# Patient Record
Sex: Male | Born: 1935 | Race: White | Hispanic: No | Marital: Single | Smoking: Never smoker
Health system: Southern US, Community
[De-identification: ages and names within clinical notes are randomized; demographics above are authoritative.]

## PROBLEM LIST (undated history)

## (undated) DIAGNOSIS — E119 Type 2 diabetes mellitus without complications: Secondary | ICD-10-CM

## (undated) DIAGNOSIS — I10 Essential (primary) hypertension: Secondary | ICD-10-CM

## (undated) DIAGNOSIS — L570 Actinic keratosis: Secondary | ICD-10-CM

## (undated) HISTORY — DX: Actinic keratosis: L57.0

---

## 2004-01-07 ENCOUNTER — Other Ambulatory Visit: Payer: Self-pay

## 2004-12-10 ENCOUNTER — Other Ambulatory Visit: Payer: Self-pay

## 2004-12-29 ENCOUNTER — Inpatient Hospital Stay: Payer: Self-pay | Admitting: General Practice

## 2006-08-10 ENCOUNTER — Ambulatory Visit: Payer: Self-pay | Admitting: Internal Medicine

## 2007-08-11 ENCOUNTER — Ambulatory Visit: Payer: Self-pay | Admitting: Internal Medicine

## 2012-05-13 ENCOUNTER — Ambulatory Visit: Payer: Self-pay | Admitting: Internal Medicine

## 2014-04-09 ENCOUNTER — Emergency Department: Payer: Self-pay | Admitting: Emergency Medicine

## 2014-04-09 LAB — BASIC METABOLIC PANEL
ANION GAP: 4 — AB (ref 7–16)
BUN: 27 mg/dL — ABNORMAL HIGH (ref 7–18)
CO2: 30 mmol/L (ref 21–32)
CREATININE: 1.53 mg/dL — AB (ref 0.60–1.30)
Calcium, Total: 9.1 mg/dL (ref 8.5–10.1)
Chloride: 103 mmol/L (ref 98–107)
EGFR (African American): 47 — ABNORMAL LOW
EGFR (Non-African Amer.): 41 — ABNORMAL LOW
GLUCOSE: 218 mg/dL — AB (ref 65–99)
Osmolality: 286 (ref 275–301)
POTASSIUM: 4 mmol/L (ref 3.5–5.1)
Sodium: 137 mmol/L (ref 136–145)

## 2014-04-09 LAB — CBC WITH DIFFERENTIAL/PLATELET
BASOS PCT: 0.3 %
Basophil #: 0 10*3/uL (ref 0.0–0.1)
EOS ABS: 0.1 10*3/uL (ref 0.0–0.7)
EOS PCT: 0.9 %
HCT: 40.9 % (ref 40.0–52.0)
HGB: 13.5 g/dL (ref 13.0–18.0)
LYMPHS ABS: 1.4 10*3/uL (ref 1.0–3.6)
Lymphocyte %: 11.5 %
MCH: 31.2 pg (ref 26.0–34.0)
MCHC: 33.1 g/dL (ref 32.0–36.0)
MCV: 94 fL (ref 80–100)
Monocyte #: 1.4 x10 3/mm — ABNORMAL HIGH (ref 0.2–1.0)
Monocyte %: 12 %
Neutrophil #: 8.9 10*3/uL — ABNORMAL HIGH (ref 1.4–6.5)
Neutrophil %: 75.3 %
PLATELETS: 161 10*3/uL (ref 150–440)
RBC: 4.34 10*6/uL — ABNORMAL LOW (ref 4.40–5.90)
RDW: 13 % (ref 11.5–14.5)
WBC: 11.8 10*3/uL — AB (ref 3.8–10.6)

## 2014-04-09 LAB — URIC ACID: Uric Acid: 7.8 mg/dL — ABNORMAL HIGH (ref 3.5–7.2)

## 2014-05-24 ENCOUNTER — Emergency Department: Payer: Self-pay | Admitting: Emergency Medicine

## 2016-09-21 DIAGNOSIS — C4491 Basal cell carcinoma of skin, unspecified: Secondary | ICD-10-CM

## 2016-09-21 HISTORY — DX: Basal cell carcinoma of skin, unspecified: C44.91

## 2017-09-02 ENCOUNTER — Other Ambulatory Visit: Payer: Self-pay | Admitting: Internal Medicine

## 2017-09-02 DIAGNOSIS — R634 Abnormal weight loss: Secondary | ICD-10-CM

## 2017-09-09 ENCOUNTER — Ambulatory Visit
Admission: RE | Admit: 2017-09-09 | Discharge: 2017-09-09 | Disposition: A | Discharge status: Home / Self Care | Payer: Medicare Other | Source: Ambulatory Visit | Attending: Internal Medicine | Admitting: Internal Medicine

## 2017-09-09 DIAGNOSIS — R9341 Abnormal radiologic findings on diagnostic imaging of renal pelvis, ureter, or bladder: Secondary | ICD-10-CM | POA: Diagnosis not present

## 2017-09-09 DIAGNOSIS — I2510 Atherosclerotic heart disease of native coronary artery without angina pectoris: Secondary | ICD-10-CM | POA: Insufficient documentation

## 2017-09-09 DIAGNOSIS — R634 Abnormal weight loss: Secondary | ICD-10-CM | POA: Insufficient documentation

## 2017-09-09 DIAGNOSIS — I77810 Thoracic aortic ectasia: Secondary | ICD-10-CM | POA: Diagnosis not present

## 2017-09-09 HISTORY — DX: Type 2 diabetes mellitus without complications: E11.9

## 2017-09-09 HISTORY — DX: Essential (primary) hypertension: I10

## 2017-09-09 MED ORDER — IOPAMIDOL (ISOVUE-300) INJECTION 61%
100.0000 mL | Freq: Once | INTRAVENOUS | Status: AC | PRN
  Administered 2017-09-09: 100 mL via INTRAVENOUS

## 2017-12-10 ENCOUNTER — Emergency Department
Admission: EM | Admit: 2017-12-10 | Discharge: 2017-12-10 | Disposition: A | Discharge status: Home / Self Care | Payer: Medicare Other | Attending: Emergency Medicine | Admitting: Emergency Medicine

## 2017-12-10 ENCOUNTER — Encounter: Payer: Self-pay | Admitting: Emergency Medicine

## 2017-12-10 ENCOUNTER — Emergency Department: Payer: Medicare Other

## 2017-12-10 DIAGNOSIS — E119 Type 2 diabetes mellitus without complications: Secondary | ICD-10-CM | POA: Insufficient documentation

## 2017-12-10 DIAGNOSIS — M79622 Pain in left upper arm: Secondary | ICD-10-CM | POA: Diagnosis present

## 2017-12-10 DIAGNOSIS — Z7984 Long term (current) use of oral hypoglycemic drugs: Secondary | ICD-10-CM | POA: Diagnosis not present

## 2017-12-10 DIAGNOSIS — M7522 Bicipital tendinitis, left shoulder: Secondary | ICD-10-CM | POA: Diagnosis not present

## 2017-12-10 DIAGNOSIS — I10 Essential (primary) hypertension: Secondary | ICD-10-CM | POA: Insufficient documentation

## 2017-12-10 LAB — GLUCOSE, CAPILLARY: Glucose-Capillary: 94 mg/dL (ref 65–99)

## 2017-12-10 MED ORDER — TRAMADOL-ACETAMINOPHEN 37.5-325 MG PO TABS: 1.0000 | ORAL_TABLET | Freq: Two times a day (BID) | ORAL | 0 refills | Status: DC

## 2017-12-10 NOTE — ED Notes (Signed)
Returned from  U/S with family  Per U/S tech he became dizzy while having the test done

## 2017-12-10 NOTE — ED Provider Notes (Signed)
[facility] Emergency Department Provider Note   ____________________________________________   First MD Initiated Contact with Patient 12/10/17 [phone]     (approximate)  I have reviewed the triage vital signs and the nursing notes.   HISTORY  Chief Complaint Arm Pain    HPI [name] is a 83 y.o. male patient complaining of increasing left upper arm pain for 1 week. Patient now presents with limited range of motion with adduction and overhead reaching. A posterior long head biceps tendinitis is concerned. Patient denies any provocative incident for her complaint. No palliative measures for complaint.   Past Medical History:  Diagnosis Date  . Diabetes mellitus without complication ([place])   . Hypertension     There are no active problems to display for this patient.   History reviewed. No pertinent surgical history.  Prior to Admission medications   Medication Sig Start Date End Date Taking? Authorizing Provider  allopurinol (ZYLOPRIM) 100 MG tablet Take 100 mg by mouth daily.   Yes [provider]  colchicine 0.6 MG tablet Take 0.6 mg by mouth daily.   Yes [provider]  glimepiride (AMARYL) 4 MG tablet Take 4 mg by mouth daily with breakfast.   Yes [provider]  metoprolol tartrate (LOPRESSOR) 50 MG tablet Take 50 mg by mouth 2 (two) times daily.   Yes [provider]  simvastatin (ZOCOR) 40 MG tablet Take 40 mg by mouth daily.   Yes [provider]  valsartan-hydrochlorothiazide (DIOVAN-HCT) 160-12.5 MG tablet Take 1 tablet by mouth daily.   Yes [provider]  traMADol-acetaminophen (ULTRACET) 37.5-325 MG tablet Take 1 tablet by mouth 2 (two) times daily. 1 tablet every 12 hours when necessary pain. 12/10/17   Sable Feil, PA-C    Allergies Patient has no known allergies.  History reviewed. No pertinent family history.  Social History Social History   Tobacco Use  .  Smoking status: Not on file  Substance Use Topics  . Alcohol use: Not on file  . Drug use: Not on file    Review of Systems Constitutional: No fever/chills Eyes: No visual changes. ENT: No sore throat. Cardiovascular: Denies chest pain. Respiratory: Denies shortness of breath. Gastrointestinal: No abdominal pain.  No nausea, no vomiting.  No diarrhea.  No constipation. Genitourinary: Negative for dysuria. Musculoskeletal: Left upper arm pain Skin: Negative for rash. Neurological: Negative for headaches, focal weakness or numbness. Endocrine:Diabetes and hypertension ____________________________________________   PHYSICAL EXAM:  VITAL SIGNS: ED Triage Vitals  Enc Vitals Group     BP 12/10/17 0854 137/62     Pulse Rate 12/10/17 0854 (!) 57     Resp 12/10/17 0854 16     Temp 12/10/17 0854 98.1 F (36.7 C)     Temp Source 12/10/17 0854 Oral     SpO2 12/10/17 0854 99 %     Weight 12/10/17 0855 193 lb (87.5 kg)     Height 12/10/17 0855 5' 8.5" (1.74 m)     Head Circumference --      Peak Flow --      Pain Score 12/10/17 0851 6     Pain Loc --      Pain Edu? --      Excl. in [place]? --     Constitutional: Alert and oriented. Well appearing and in no acute distress. Cardiovascular: Normal rate, regular rhythm. Grossly normal heart sounds.  Good peripheral circulation. Respiratory: Normal respiratory effort.  No retractions. Lungs CTAB. Gastrointestinal: Soft  and nontender. No distention. No abdominal bruits. No CVA tenderness. Musculoskeletal: No obvious deformity edema or erythema to the left bicep area. Patient has moderate guarding palpation sensation part of that showed bicep head. Patient has decreased range of motion with extension and overhead reaching. Neurologic:  Normal speech and language. No gross focal neurologic deficits are appreciated. No gait instability. Skin:  Skin is warm, dry and intact. No rash noted. Psychiatric: Mood and affect are normal. Speech and  behavior are normal.  ____________________________________________   LABS (all labs ordered are listed, but only abnormal results are displayed)  Labs Reviewed  GLUCOSE, CAPILLARY  CBG MONITORING, ED   ____________________________________________  EKG   ____________________________________________  RADIOLOGY  [place] Extrem Up Left Ltd  Result Date: 12/10/2017 CLINICAL DATA:  Left biceps and forearm pain. EXAM: ULTRASOUND LEFT UPPER EXTREMITY LIMITED TECHNIQUE: Ultrasound examination of the upper extremity soft tissues was performed in the area of clinical concern. COMPARISON:  None FINDINGS: There is no visible muscle or tendon tear. No mass or abnormal fluid collection in the areas of concern. IMPRESSION: Negative ultrasound of the distal biceps and forearm. Electronically Signed   By: Lorriane Shire M.D.   On: 12/10/2017 10:31    ____________________________________________   PROCEDURES  Procedure(s) performed: None  Procedures  Critical Care performed: No  ____________________________________________   INITIAL IMPRESSION / ASSESSMENT AND PLAN / ED COURSE  As part of my medical decision making, I reviewed the following data within the electronic MEDICAL RECORD NUMBER    Left upper arm pain secondary to tendinitis. Discussed the ultrasound findings with patient. Patient given discharge care instruction prostate medication as needed for pain. Advised to follow up PCP if condition persists.      ____________________________________________   FINAL CLINICAL IMPRESSION(S) / ED DIAGNOSES  Final diagnoses:  Biceps tendinitis on left     ED Discharge Orders        Ordered    traMADol-acetaminophen (ULTRACET) 37.5-325 MG tablet  2 times daily     12/10/17 1038       Note:  This document was prepared using Dragon voice recognition software and may include unintentional dictation errors.    Sable Feil, PA-C 12/10/17 1042    Nena Polio, MD 12/10/17  [phone]

## 2017-12-10 NOTE — ED Triage Notes (Signed)
Patient arrives to [facility] ED via POV with complaint of left arm pain for 1 week. Patient states that pain has gotten progressively worse and now has limited ROM at the shoulder. Denies CP/SHOB

## 2017-12-10 NOTE — ED Notes (Signed)
See triage note.  Presents with left arm pain which started several days ago. Denies any injury  States pain is from mid upper arm and radiates to elbow  Increased pain with movement

## 2018-03-18 IMAGING — CT CT ABD-PELV W/ CM
1 of 3 series · 13 of 32 positions shown, 18 images · IV contrast (iopamidol)
Comparison: None.

CLINICAL DATA: Unexplained weight loss over the last 4 months,
fatigue

EXAM:
CT ABDOMEN AND PELVIS WITH CONTRAST
TECHNIQUE: Multidetector CT imaging of the abdomen and pelvis was performed
using the standard protocol following bolus administration of
intravenous contrast.
CONTRAST:  100mL 2GBUAR-K22 IOPAMIDOL (2GBUAR-K22) INJECTION 61%

[Series 2: axial st · axial · 0.84mm/px · z∈[-962,-522]mm · 13 of 100 slices shown, 18 images]
[im 6/100  soft-tissue]
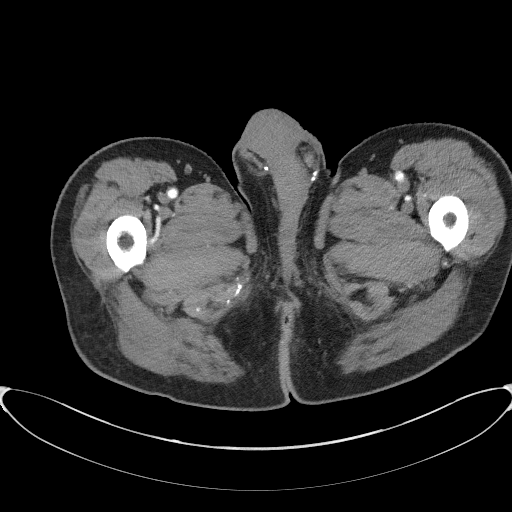
[im 6/100  bone]
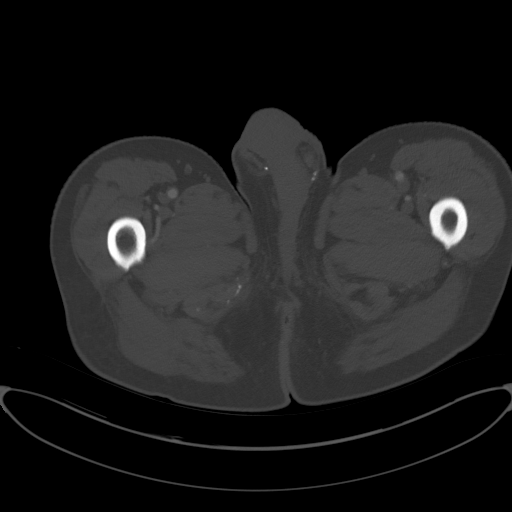
[im 17/100  soft-tissue]
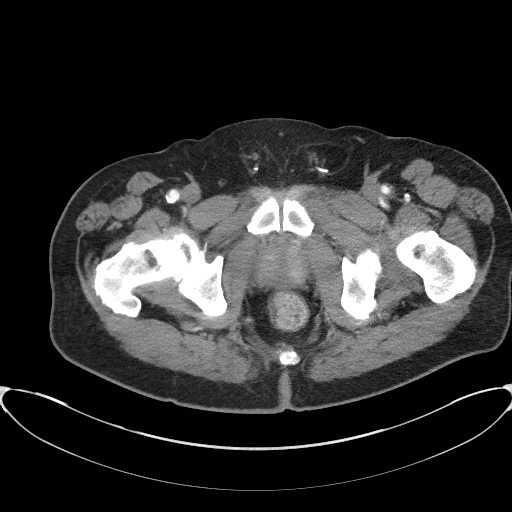
[im 23/100  soft-tissue]
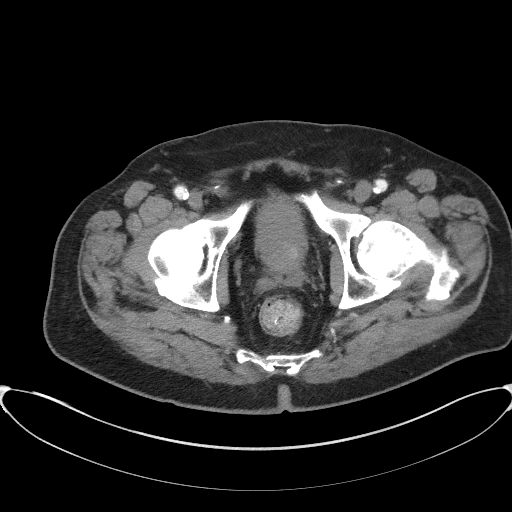
[im 28/100  soft-tissue]
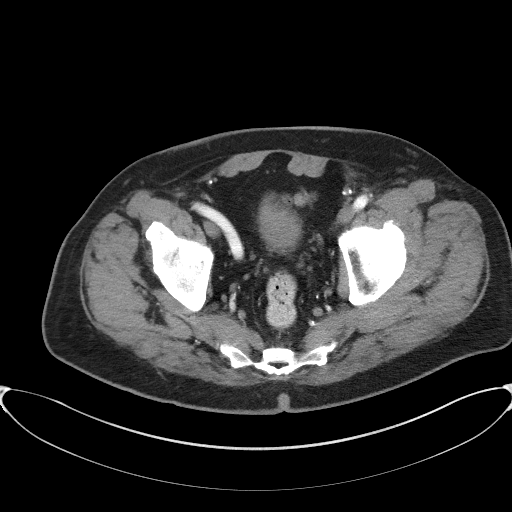
[im 39/100  soft-tissue]
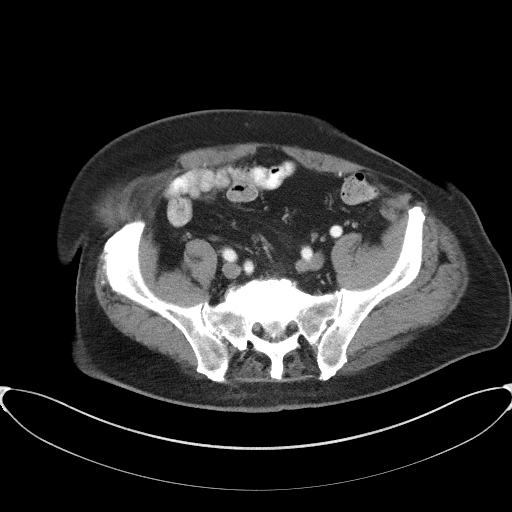
[im 45/100  soft-tissue]
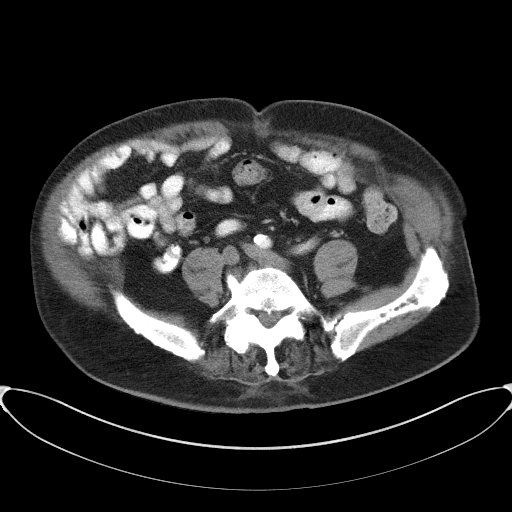
[im 56/100  soft-tissue]
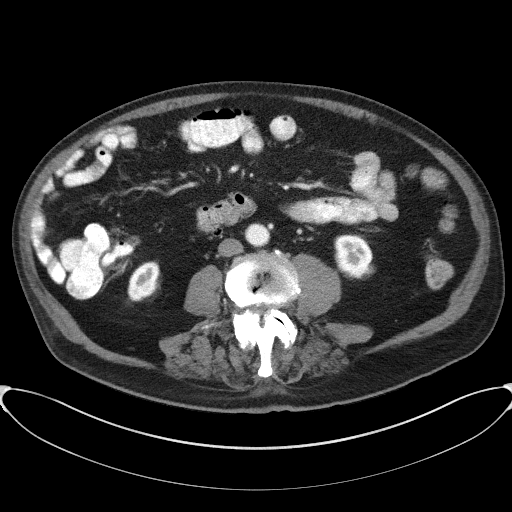
[im 61/100  soft-tissue]
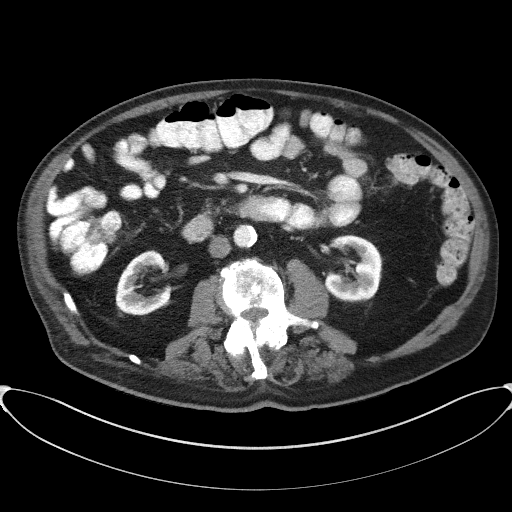
[im 72/100  soft-tissue]
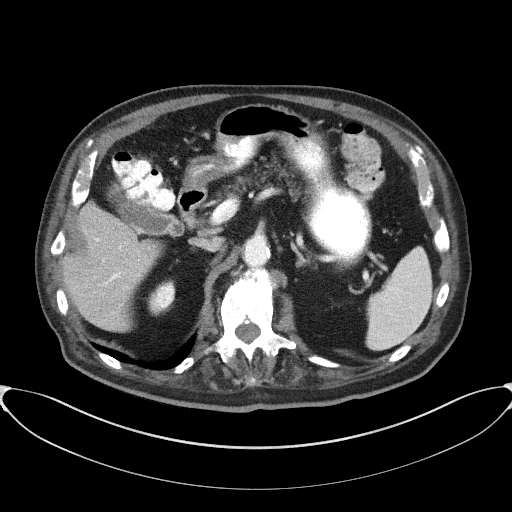
[im 72/100  bone]
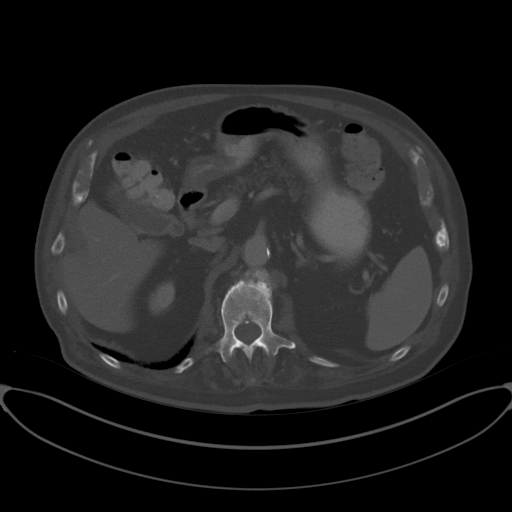
[im 78/100  soft-tissue]
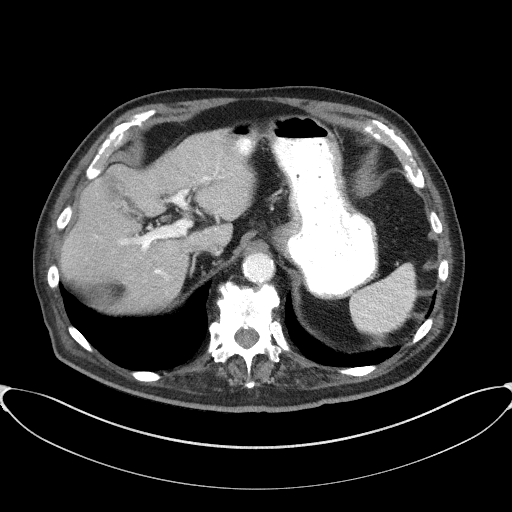
[im 78/100  lung]
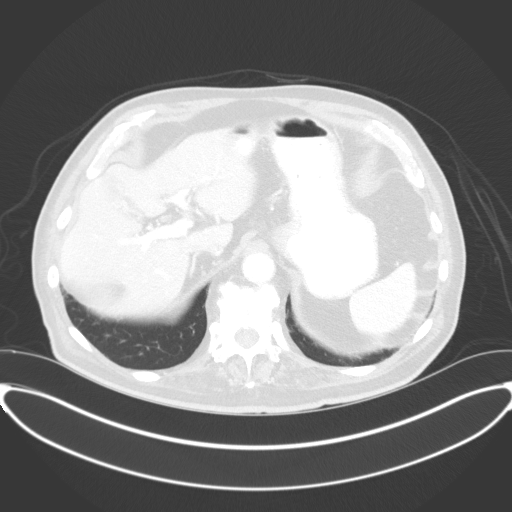
[im 83/100  soft-tissue]
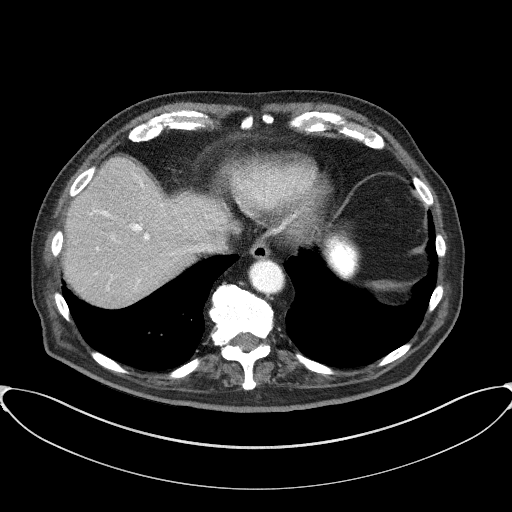
[im 83/100  lung]
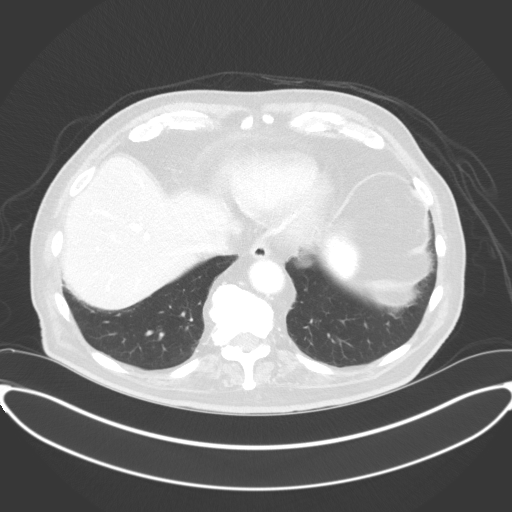
[im 89/100  lung]
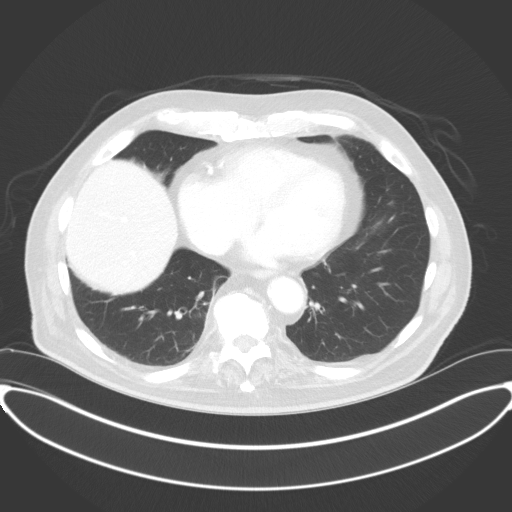
[im 94/100  soft-tissue]
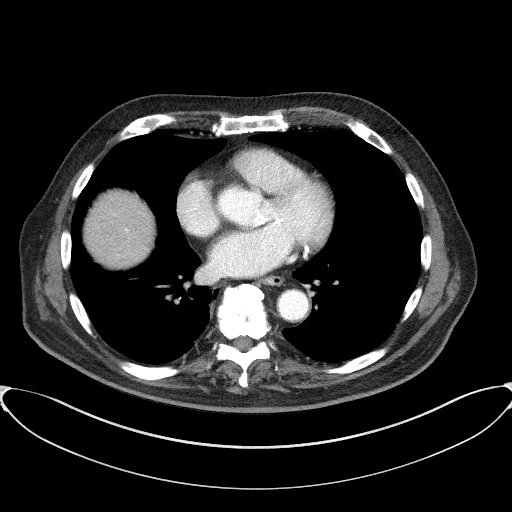
[im 94/100  lung]
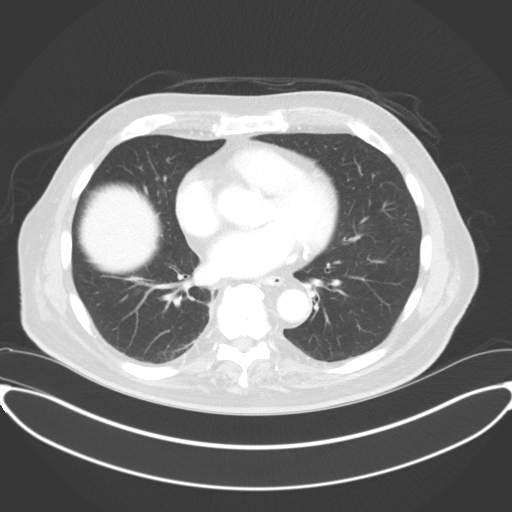

[13 of 32 positions shown; findings below may reference images not displayed]

FINDINGS: Lower chest: The lung bases are clear. The mid ascending thoracic
aorta measures 40 mm in diameter which may indicate developing
fusiform thoracic aortic aneurysm. Recommend annual imaging followup
by CTA or MRA. This recommendation follows 8535
ACCF/AHA/AATS/ACR/ASA/SCA/ASIL/SALAH EL/NASH/NADDEO Guidelines for the
Diagnosis and Management of Patients with Thoracic Aortic Disease.
Circulation. 8535; 121: [id number]-e369. Also, diffuse coronary artery
calcifications are noted. Moderate thoracic aortic atherosclerotic
change also is present.

Hepatobiliary: The liver enhances with no focal abnormality and no
ductal dilatation is seen. The gallbladder is visualized and no
calcified gallstones are seen the gallbladder wall is somewhat
irregular and thickened although no other evidence of acute
cholecystitis is seen by CT. Correlate clinically and consider
ultrasound of the gallbladder if warranted.

Pancreas: There is fatty infiltration of the pancreas diffusely. The
pancreatic duct is not dilated.

Spleen: The spleen is unremarkable.

Adrenals/Urinary Tract: The adrenal glands appear normal. The
kidneys enhance and on delayed images, the pelvocaliceal systems
appear normal. The ureters are normal in caliber to the urinary
bladder. The urinary bladder is very thick-walled and decompressed
making assessment difficult. In view of the diffuse wall thickening,
bladder outlet obstruction is a definite consideration.

Stomach/Bowel: The stomach is moderately distended with oral
contrast and food debris. No abnormality is seen. No small bowel
distention is noted. There are scattered rectosigmoid colon
diverticula present in the rectosigmoid colon is the elongated and
tortuous. The terminal ileum is unremarkable. The appendix is not
visualized, but no inflammatory process is seen.

Vascular/Lymphatic: The abdominal aorta is normal in caliber with
moderate abdominal aortic atherosclerotic change diffusely.

Reproductive: The prostate is normal in size for age.

Other: None.

Musculoskeletal: All
IMPRESSION: 1. No explanation for the patient's weight loss is seen.
2. Somewhat thickened gallbladder wall with irregularity of
uncertain significance. No other evidence of acute cholecystitis is
seen by CT. Consider ultrasound of the gallbladder to assess
further.
3. Fusiform dilatation of the ascending thoracic aorta worrisome for
developing neoplasm. Recommend annual imaging followup by CTA or
MRA. This recommendation follows 8535
ACCF/AHA/AATS/ACR/ASA/SCA/ASIL/SALAH EL/NASH/NADDEO Guidelines for the
Diagnosis and Management of Patients with Thoracic Aortic Disease.
Circulation. 8535; 121: [id number].
4. Diffuse coronary artery calcifications.
5. Very thick-walled urinary bladder which is decompressed. Consider
bladder outlet obstruction.

## 2019-10-09 IMAGING — US US EXTREM UP*L* LTD
1 series · 10 of 10 positions shown · non-contrast
Comparison: None

CLINICAL DATA: Left biceps and forearm pain.

EXAM:
ULTRASOUND LEFT UPPER EXTREMITY LIMITED
TECHNIQUE: Ultrasound examination of the upper extremity soft tissues was
performed in the area of clinical concern.

[Series 1: us extrem up*left* ltd · 0.07mm/px · 10 of 10 slices shown]
[im 1/10]
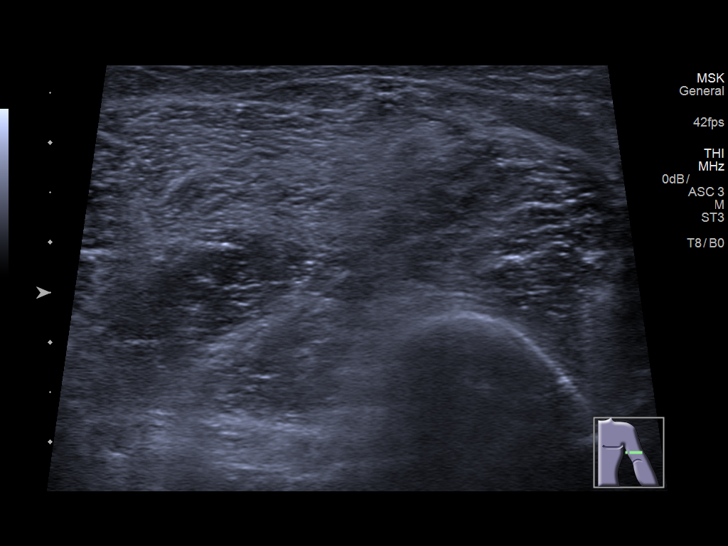
[im 2/10]
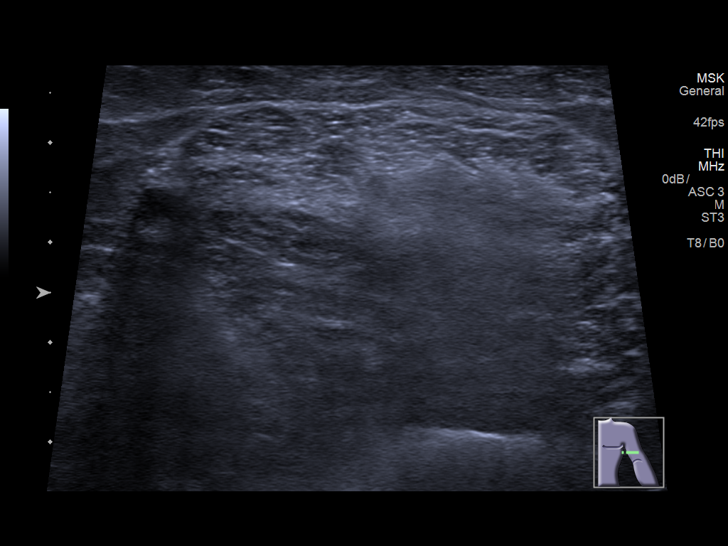
[im 3/10]
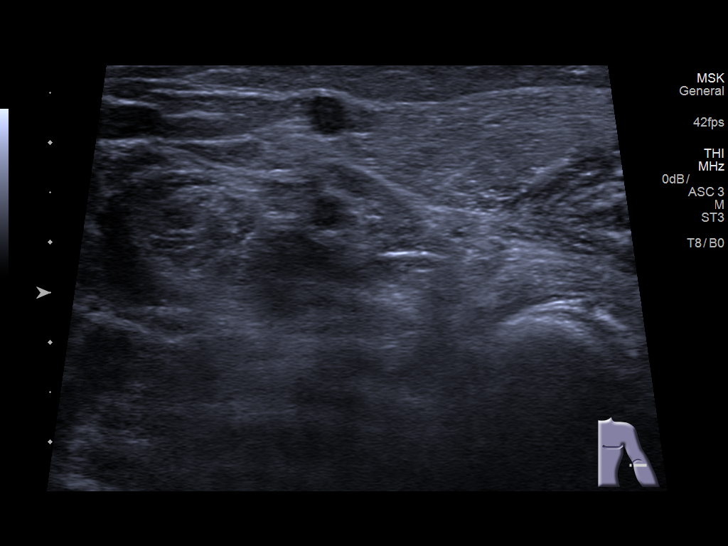
[im 4/10]
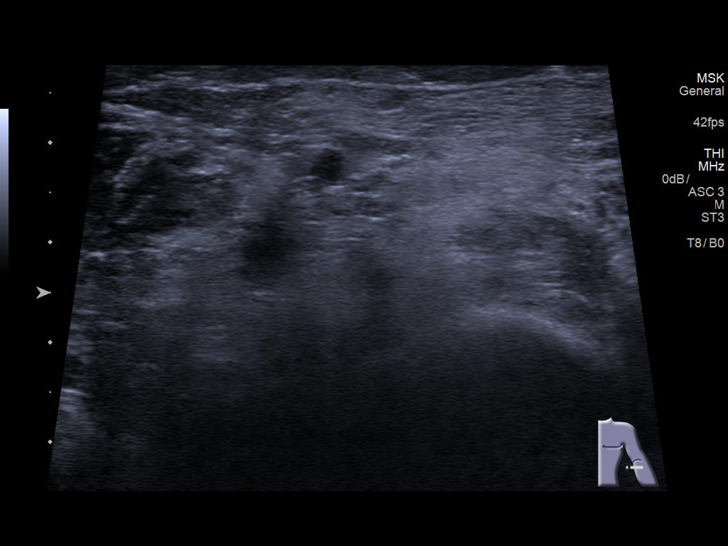
[im 5/10]
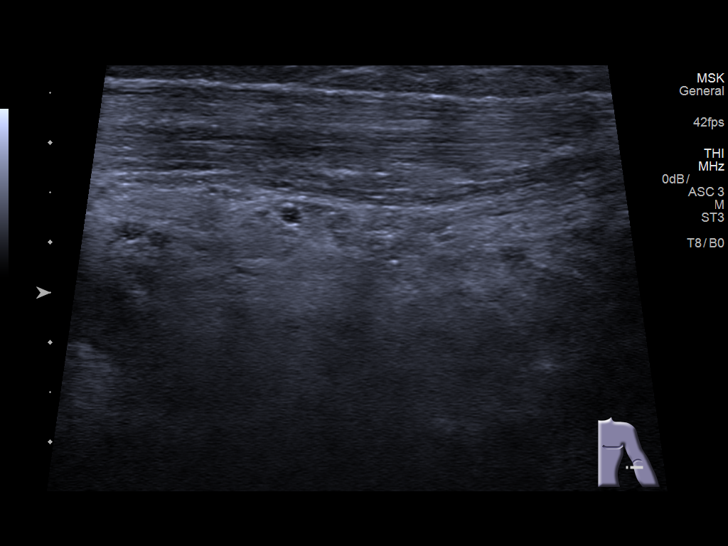
[im 6/10]
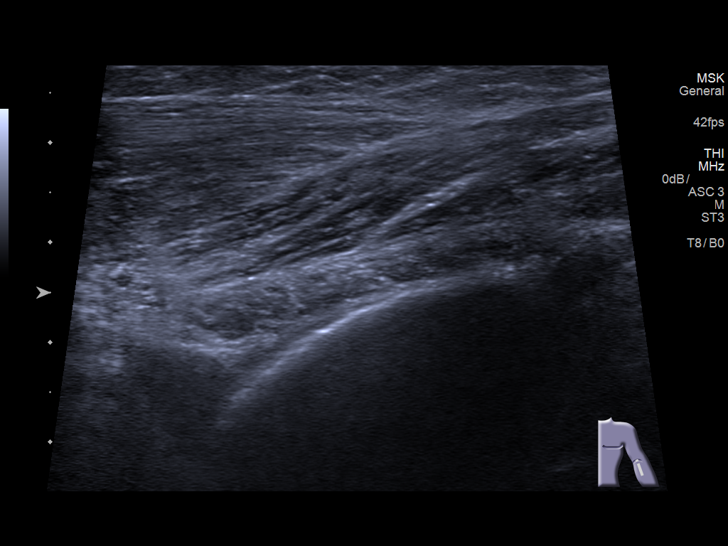
[im 7/10]
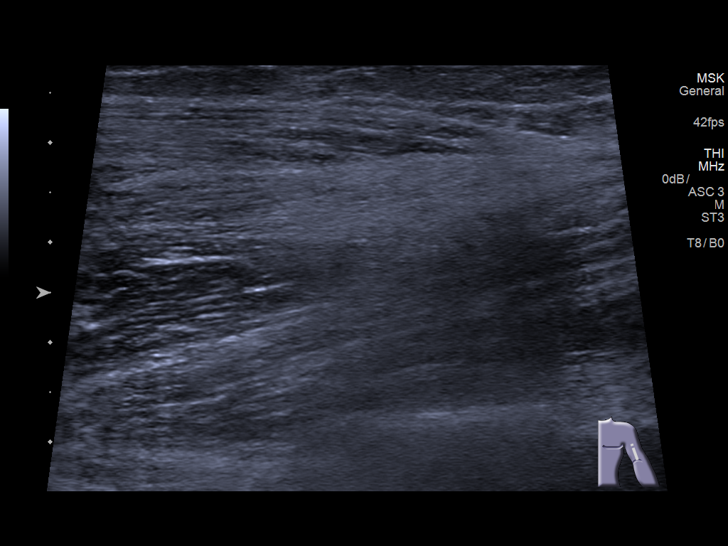
[im 8/10]
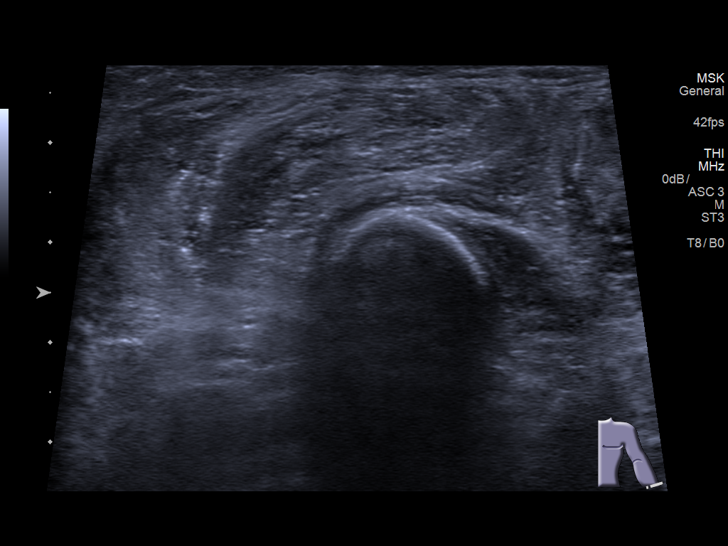
[im 9/10]
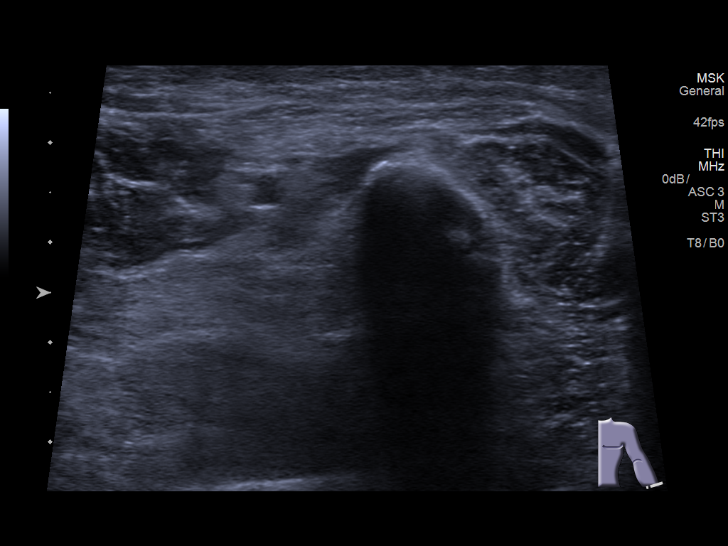
[im 10/10]
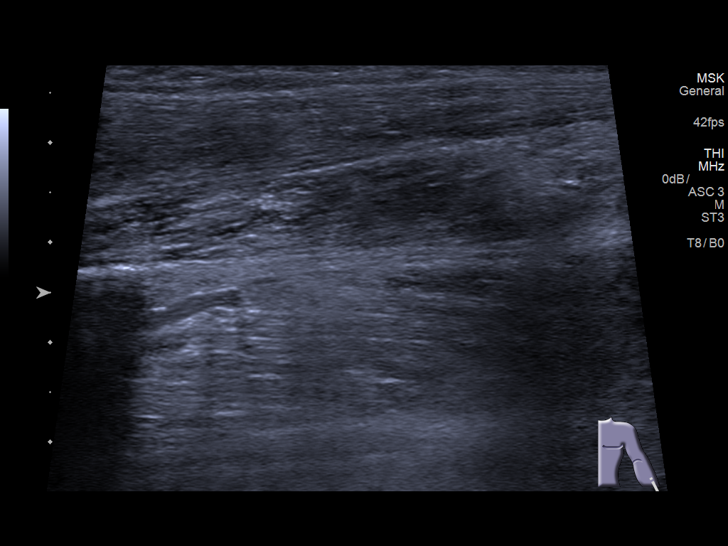

[10 of 10 positions shown; findings below may reference images not displayed]

FINDINGS: There is no visible muscle or tendon tear. No mass or abnormal fluid
collection in the areas of concern.
IMPRESSION: Negative ultrasound of the distal biceps and forearm.

## 2021-03-12 ENCOUNTER — Encounter: Payer: Self-pay | Admitting: Dermatology

## 2021-03-12 ENCOUNTER — Ambulatory Visit (INDEPENDENT_AMBULATORY_CARE_PROVIDER_SITE_OTHER): Payer: Medicare Other | Admitting: Dermatology

## 2021-03-12 ENCOUNTER — Other Ambulatory Visit: Payer: Self-pay

## 2021-03-12 DIAGNOSIS — L578 Other skin changes due to chronic exposure to nonionizing radiation: Secondary | ICD-10-CM | POA: Diagnosis not present

## 2021-03-12 DIAGNOSIS — D492 Neoplasm of unspecified behavior of bone, soft tissue, and skin: Secondary | ICD-10-CM

## 2021-03-12 DIAGNOSIS — C4492 Squamous cell carcinoma of skin, unspecified: Secondary | ICD-10-CM

## 2021-03-12 DIAGNOSIS — C4442 Squamous cell carcinoma of skin of scalp and neck: Secondary | ICD-10-CM | POA: Diagnosis not present

## 2021-03-12 DIAGNOSIS — L570 Actinic keratosis: Secondary | ICD-10-CM

## 2021-03-12 DIAGNOSIS — L821 Other seborrheic keratosis: Secondary | ICD-10-CM

## 2021-03-12 DIAGNOSIS — Z85828 Personal history of other malignant neoplasm of skin: Secondary | ICD-10-CM

## 2021-03-12 HISTORY — DX: Squamous cell carcinoma of skin, unspecified: C44.92

## 2021-03-12 NOTE — Progress Notes (Signed)
New Patient Visit  Subjective  [name] is a 87 y.o. male who presents for the following: check spot on scalp (Scalp, >5 yrs, pt thinks has partially been txted in past but no bx records in [place]).  Patient accompanied by Marvetta Gibbons, POA.  The following portions of the chart were reviewed this encounter and updated as appropriate:   Allergies  Meds  Problems  Med Hx  Surg Hx  Fam Hx      Review of Systems:  No other skin or systemic complaints except as noted in HPI or Assessment and Plan.  Objective  Well appearing patient in no apparent distress; mood and affect are within normal limits.  A focused examination was performed including scalp. Relevant physical exam findings are noted in the Assessment and Plan.  Objective  L of midline vertex scalp: Hyperkeratotic pap 1.2cm     Objective  scalp x 18 (18): Pink scaly macules    Assessment & Plan    Seborrheic Keratoses - Stuck-on, waxy, tan-brown papules and plaques  - Discussed benign etiology and prognosis. - Observe - Call for any changes  Actinic Damage - chronic, secondary to cumulative UV radiation exposure/sun exposure over time - diffuse scaly erythematous macules with underlying dyspigmentation - Recommend daily broad spectrum sunscreen SPF 30+ to sun-exposed areas, reapply every 2 hours as needed.  - Recommend staying in the shade or wearing long sleeves, sun glasses (UVA+UVB protection) and wide brim hats (4-inch brim around the entire circumference of the hat). - Call for new or changing lesions.  History of Basal Cell Carcinoma of the Skin - No evidence of recurrence today - Recommend regular full body skin exams - Recommend daily broad spectrum sunscreen SPF 30+ to sun-exposed areas, reapply every 2 hours as needed.  - Call if any new or changing lesions are noted between office visits - R shoulder  Neoplasm of skin L of midline vertex scalp  Epidermal / dermal  shaving  Lesion diameter (cm):  1.2 Informed consent: discussed and consent obtained   Timeout: patient name, date of birth, surgical site, and procedure verified   Procedure prep:  Patient was prepped and draped in usual sterile fashion Prep type:  Isopropyl alcohol Anesthesia: the lesion was anesthetized in a standard fashion   Anesthetic:  1% lidocaine w/ epinephrine 1-100,000 buffered w/ 8.4% NaHCO3 Instrument used: flexible razor blade   Hemostasis achieved with: pressure, aluminum chloride and electrodesiccation   Outcome: patient tolerated procedure well   Post-procedure details: sterile dressing applied and wound care instructions given   Dressing type: bandage and petrolatum    Destruction of lesion Complexity: extensive   Destruction method: electrodesiccation and curettage   Informed consent: discussed and consent obtained   Timeout:  patient name, date of birth, surgical site, and procedure verified Procedure prep:  Patient was prepped and draped in usual sterile fashion Prep type:  Isopropyl alcohol Anesthesia: the lesion was anesthetized in a standard fashion   Anesthetic:  1% lidocaine w/ epinephrine 1-100,000 buffered w/ 8.4% NaHCO3 Curettage performed in three different directions: Yes   Electrodesiccation performed over the curetted area: Yes   Lesion length (cm):  1.2 Lesion width (cm):  1.2 Margin per side (cm):  0.2 Final wound size (cm):  1.6 Hemostasis achieved with:  pressure, aluminum chloride and electrodesiccation Outcome: patient tolerated procedure well with no complications   Post-procedure details: sterile dressing applied and wound care instructions given   Dressing type: bandage and bacitracin  Specimen 1 - Surgical pathology Differential Diagnosis: D48.5 R/O SCC  Check Margins: No Hyperkeratotic pap 1.2cm EDC today  R/O SCC shave removal and EDC today.  Discussed risk of recurrence.  AK (actinic keratosis) (18) scalp x 18  Destruction  of lesion - scalp x 18 Complexity: simple   Destruction method: cryotherapy   Informed consent: discussed and consent obtained   Timeout:  patient name, date of birth, surgical site, and procedure verified Lesion destroyed using liquid nitrogen: Yes   Region frozen until ice ball extended beyond lesion: Yes   Outcome: patient tolerated procedure well with no complications   Post-procedure details: wound care instructions given    Return in about 4 months (around 07/12/2021) for [place] f/u.   I, Othelia Pulling, RMA, am acting as scribe for Sarina Ser, MD .  Documentation: I have reviewed the above documentation for accuracy and completeness, and I agree with the above.  Sarina Ser, MD

## 2021-03-12 NOTE — Patient Instructions (Addendum)
If you have any questions or concerns for your doctor, please call our main line at [phone] and press option 4 to reach your doctor's medical assistant. If no one answers, please leave a voicemail as directed and we will return your call as soon as possible. Messages left after 4 pm will be answered the following business day.   You may also send us a message via MyChart. We typically respond to MyChart messages within 1-2 business days.  For prescription refills, please ask your pharmacy to contact our office. Our fax number is [phone].  If you have an urgent issue when the clinic is closed that cannot wait until the next business day, you can page your doctor at the number below.    Please note that while we do our best to be available for urgent issues outside of office hours, we are not available 24/7.   If you have an urgent issue and are unable to reach us, you may choose to seek medical care at your doctor's office, retail clinic, urgent care center, or emergency room.  If you have a medical emergency, please immediately call 911 or go to the emergency department.  Pager Numbers  - Dr. Kowalski: [phone]  - Dr. Moye: [phone]  - Dr. Stewart: [phone]  In the event of inclement weather, please call our main line at [phone] for an update on the status of any delays or closures.  Dermatology Medication Tips: Please keep the boxes that topical medications come in in order to help keep track of the instructions about where and how to use these. Pharmacies typically print the medication instructions only on the boxes and not directly on the medication tubes.   If your medication is too expensive, please contact our office at [phone] option 4 or send us a message through MyChart.   We are unable to tell what your co-pay for medications will be in advance as this is different depending on your insurance coverage. However, we may be able to find a  substitute medication at lower cost or fill out paperwork to get insurance to cover a needed medication.   If a prior authorization is required to get your medication covered by your insurance company, please allow us 1-2 business days to complete this process.  Drug prices often vary depending on where the prescription is filled and some pharmacies may offer cheaper prices.  The website www.goodrx.com contains coupons for medications through different pharmacies. The prices here do not account for what the cost may be with help from insurance (it may be cheaper with your insurance), but the website can give you the price if you did not use any insurance.  - You can print the associated coupon and take it with your prescription to the pharmacy.  - You may also stop by our office during regular business hours and pick up a GoodRx coupon card.  - If you need your prescription sent electronically to a different pharmacy, notify our office through [facility] MyChart or by phone at [phone] option 4.     Wound Care Instructions  1. Cleanse wound gently with soap and water once a day then pat dry with clean gauze. Apply a thing coat of Petrolatum (petroleum jelly, "Vaseline") over the wound (unless you have an allergy to this). We recommend that you use a new, sterile tube of Vaseline. Do not pick or remove scabs. Do not remove the yellow or white "healing tissue" from the base of the wound.    2. Cover the wound with fresh, clean, nonstick gauze and secure with paper tape. You may use Band-Aids in place of gauze and tape if the would is small enough, but would recommend trimming much of the tape off as there is often too much. Sometimes Band-Aids can irritate the skin.  3. You should call the office for your biopsy report after 1 week if you have not already been contacted.  4. If you experience any problems, such as abnormal amounts of bleeding, swelling, significant bruising, significant pain,  or evidence of infection, please call the office immediately.  5. FOR ADULT SURGERY PATIENTS: If you need something for pain relief you may take 1 extra strength Tylenol (acetaminophen) AND 2 Ibuprofen (200mg each) together every 4 hours as needed for pain. (do not take these if you are allergic to them or if you have a reason you should not take them.) Typically, you may only need pain medication for 1 to 3 days.     

## 2021-03-17 ENCOUNTER — Encounter: Payer: Self-pay | Admitting: Dermatology

## 2021-03-17 ENCOUNTER — Telehealth: Payer: Self-pay

## 2021-03-17 NOTE — Telephone Encounter (Signed)
-----   Message from Ralene Bathe, MD sent at 03/17/2021 10:26 AM EDT ----- Diagnosis Skin , L of midline vertex scalp, skin WELL DIFFERENTIATED SQUAMOUS CELL CARCINOMA WITH SUPERFICIAL INFILTRATION, SEE DESCRIPTION  Cancer - SCC Already treated Recheck next visit Keep follow up appt

## 2021-03-17 NOTE — Telephone Encounter (Signed)
Called pt discussed biopsy results  

## 2021-06-17 ENCOUNTER — Inpatient Hospital Stay
Admission: EM | Admit: 2021-06-17 | Discharge: 2021-06-23 | DRG: 177 | Disposition: A | Discharge status: Hospice | Payer: Medicare Other | Attending: Internal Medicine | Admitting: Internal Medicine

## 2021-06-17 DIAGNOSIS — G9340 Encephalopathy, unspecified: Secondary | ICD-10-CM | POA: Diagnosis present

## 2021-06-17 DIAGNOSIS — Z79899 Other long term (current) drug therapy: Secondary | ICD-10-CM

## 2021-06-17 DIAGNOSIS — E785 Hyperlipidemia, unspecified: Secondary | ICD-10-CM | POA: Diagnosis present

## 2021-06-17 DIAGNOSIS — M109 Gout, unspecified: Secondary | ICD-10-CM | POA: Diagnosis present

## 2021-06-17 DIAGNOSIS — I10 Essential (primary) hypertension: Secondary | ICD-10-CM | POA: Diagnosis present

## 2021-06-17 DIAGNOSIS — G9341 Metabolic encephalopathy: Secondary | ICD-10-CM | POA: Diagnosis present

## 2021-06-17 DIAGNOSIS — D696 Thrombocytopenia, unspecified: Secondary | ICD-10-CM | POA: Diagnosis present

## 2021-06-17 DIAGNOSIS — R4182 Altered mental status, unspecified: Secondary | ICD-10-CM

## 2021-06-17 DIAGNOSIS — R531 Weakness: Secondary | ICD-10-CM

## 2021-06-17 DIAGNOSIS — R627 Adult failure to thrive: Secondary | ICD-10-CM | POA: Diagnosis present

## 2021-06-17 DIAGNOSIS — Z23 Encounter for immunization: Secondary | ICD-10-CM

## 2021-06-17 DIAGNOSIS — Z85828 Personal history of other malignant neoplasm of skin: Secondary | ICD-10-CM

## 2021-06-17 DIAGNOSIS — Z6831 Body mass index (BMI) 31.0-31.9, adult: Secondary | ICD-10-CM

## 2021-06-17 DIAGNOSIS — U071 COVID-19: Principal | ICD-10-CM

## 2021-06-17 DIAGNOSIS — Z66 Do not resuscitate: Secondary | ICD-10-CM | POA: Diagnosis present

## 2021-06-17 DIAGNOSIS — Z28310 Unvaccinated for covid-19: Secondary | ICD-10-CM

## 2021-06-17 DIAGNOSIS — Z8249 Family history of ischemic heart disease and other diseases of the circulatory system: Secondary | ICD-10-CM

## 2021-06-17 DIAGNOSIS — I2510 Atherosclerotic heart disease of native coronary artery without angina pectoris: Secondary | ICD-10-CM | POA: Diagnosis present

## 2021-06-17 DIAGNOSIS — E1165 Type 2 diabetes mellitus with hyperglycemia: Secondary | ICD-10-CM | POA: Diagnosis present

## 2021-06-17 DIAGNOSIS — R008 Other abnormalities of heart beat: Secondary | ICD-10-CM | POA: Diagnosis present

## 2021-06-17 MED ORDER — LACTATED RINGERS IV SOLN
INTRAVENOUS | Status: DC
  Administered 2021-06-18: 150 mL/h via INTRAVENOUS

## 2021-06-17 MED ORDER — SODIUM CHLORIDE 0.9 % IV SOLN
2.0000 g | Freq: Once | INTRAVENOUS | Status: AC
  Administered 2021-06-18: 2 g via INTRAVENOUS
  Filled 2021-06-17: qty 2

## 2021-06-17 MED ORDER — VANCOMYCIN HCL IN DEXTROSE 1-5 GM/200ML-% IV SOLN
1000.0000 mg | Freq: Once | INTRAVENOUS | Status: AC
  Administered 2021-06-18: 1000 mg via INTRAVENOUS
  Filled 2021-06-17: qty 200

## 2021-06-18 ENCOUNTER — Emergency Department: Payer: Medicare Other

## 2021-06-18 DIAGNOSIS — U071 COVID-19: Secondary | ICD-10-CM | POA: Diagnosis present

## 2021-06-18 DIAGNOSIS — E1165 Type 2 diabetes mellitus with hyperglycemia: Secondary | ICD-10-CM

## 2021-06-18 DIAGNOSIS — Z28310 Unvaccinated for covid-19: Secondary | ICD-10-CM | POA: Diagnosis not present

## 2021-06-18 DIAGNOSIS — G9340 Encephalopathy, unspecified: Secondary | ICD-10-CM

## 2021-06-18 DIAGNOSIS — G9341 Metabolic encephalopathy: Secondary | ICD-10-CM | POA: Diagnosis present

## 2021-06-18 DIAGNOSIS — M109 Gout, unspecified: Secondary | ICD-10-CM | POA: Diagnosis present

## 2021-06-18 DIAGNOSIS — Z6831 Body mass index (BMI) 31.0-31.9, adult: Secondary | ICD-10-CM | POA: Diagnosis not present

## 2021-06-18 DIAGNOSIS — I10 Essential (primary) hypertension: Secondary | ICD-10-CM | POA: Diagnosis present

## 2021-06-18 DIAGNOSIS — Z79899 Other long term (current) drug therapy: Secondary | ICD-10-CM | POA: Diagnosis not present

## 2021-06-18 DIAGNOSIS — D696 Thrombocytopenia, unspecified: Secondary | ICD-10-CM | POA: Diagnosis present

## 2021-06-18 DIAGNOSIS — Z66 Do not resuscitate: Secondary | ICD-10-CM | POA: Diagnosis present

## 2021-06-18 DIAGNOSIS — R531 Weakness: Secondary | ICD-10-CM

## 2021-06-18 DIAGNOSIS — R008 Other abnormalities of heart beat: Secondary | ICD-10-CM | POA: Diagnosis present

## 2021-06-18 DIAGNOSIS — Z8249 Family history of ischemic heart disease and other diseases of the circulatory system: Secondary | ICD-10-CM | POA: Diagnosis not present

## 2021-06-18 DIAGNOSIS — E785 Hyperlipidemia, unspecified: Secondary | ICD-10-CM | POA: Diagnosis present

## 2021-06-18 DIAGNOSIS — Z85828 Personal history of other malignant neoplasm of skin: Secondary | ICD-10-CM | POA: Diagnosis not present

## 2021-06-18 DIAGNOSIS — Z23 Encounter for immunization: Secondary | ICD-10-CM | POA: Diagnosis not present

## 2021-06-18 DIAGNOSIS — I2510 Atherosclerotic heart disease of native coronary artery without angina pectoris: Secondary | ICD-10-CM | POA: Diagnosis present

## 2021-06-18 DIAGNOSIS — R627 Adult failure to thrive: Secondary | ICD-10-CM | POA: Diagnosis present

## 2021-06-18 LAB — CBC WITH DIFFERENTIAL/PLATELET
Abs Immature Granulocytes: 0.01 10*3/uL (ref 0.00–0.07)
Basophils Absolute: 0 10*3/uL (ref 0.0–0.1)
Basophils Relative: 1 %
Eosinophils Absolute: 0 10*3/uL (ref 0.0–0.5)
Eosinophils Relative: 0 %
HCT: 47.4 % (ref 39.0–52.0)
Hemoglobin: 16 g/dL (ref 13.0–17.0)
Immature Granulocytes: 0 %
Lymphocytes Relative: 28 %
Lymphs Abs: 1.6 10*3/uL (ref 0.7–4.0)
MCH: 30.8 pg (ref 26.0–34.0)
MCHC: 33.8 g/dL (ref 30.0–36.0)
MCV: 91.2 fL (ref 80.0–100.0)
Monocytes Absolute: 1 10*3/uL (ref 0.1–1.0)
Monocytes Relative: 18 %
Neutro Abs: 3 10*3/uL (ref 1.7–7.7)
Neutrophils Relative %: 53 %
Platelets: 117 10*3/uL — ABNORMAL LOW (ref 150–400)
RBC: 5.2 MIL/uL (ref 4.22–5.81)
RDW: 13 % (ref 11.5–15.5)
WBC: 5.6 10*3/uL (ref 4.0–10.5)
nRBC: 0 % (ref 0.0–0.2)

## 2021-06-18 LAB — COMPREHENSIVE METABOLIC PANEL
ALT: 22 U/L (ref 0–44)
AST: 93 U/L — ABNORMAL HIGH (ref 15–41)
Albumin: 3.3 g/dL — ABNORMAL LOW (ref 3.5–5.0)
Alkaline Phosphatase: 42 U/L (ref 38–126)
Anion gap: 9 (ref 5–15)
BUN: 22 mg/dL (ref 8–23)
CO2: 25 mmol/L (ref 22–32)
Calcium: 8.8 mg/dL — ABNORMAL LOW (ref 8.9–10.3)
Chloride: 103 mmol/L (ref 98–111)
Creatinine, Ser: 1.34 mg/dL — ABNORMAL HIGH (ref 0.61–1.24)
GFR, Estimated: 50 mL/min — ABNORMAL LOW (ref >60)
Glucose, Bld: 238 mg/dL — ABNORMAL HIGH (ref 70–99)
Potassium: 3.9 mmol/L (ref 3.5–5.1)
Sodium: 137 mmol/L (ref 135–145)
Total Bilirubin: 0.7 mg/dL (ref 0.3–1.2)
Total Protein: 6.4 g/dL — ABNORMAL LOW (ref 6.5–8.1)

## 2021-06-18 LAB — PROCALCITONIN: Procalcitonin: <0.1 ng/mL

## 2021-06-18 LAB — PROTIME-INR
INR: 1 (ref 0.8–1.2)
Prothrombin Time: 13 seconds (ref 11.4–15.2)

## 2021-06-18 LAB — CBG MONITORING, ED
Glucose-Capillary: 190 mg/dL — ABNORMAL HIGH (ref 70–99)
Glucose-Capillary: 250 mg/dL — ABNORMAL HIGH (ref 70–99)
Glucose-Capillary: 233 mg/dL — ABNORMAL HIGH (ref 70–99)
Glucose-Capillary: 218 mg/dL — ABNORMAL HIGH (ref 70–99)
Glucose-Capillary: 157 mg/dL — ABNORMAL HIGH (ref 70–99)

## 2021-06-18 LAB — URINALYSIS, COMPLETE (UACMP) WITH MICROSCOPIC
Bilirubin Urine: NEGATIVE
Glucose, UA: 150 mg/dL — AB
Ketones, ur: 5 mg/dL — AB
Leukocytes,Ua: NEGATIVE
Nitrite: NEGATIVE
Protein, ur: 30 mg/dL — AB
Specific Gravity, Urine: 1.023 (ref 1.005–1.030)
pH: 5 (ref 5.0–8.0)

## 2021-06-18 LAB — LACTIC ACID, PLASMA: Lactic Acid, Venous: 1.7 mmol/L (ref 0.5–1.9)

## 2021-06-18 LAB — LACTATE DEHYDROGENASE: LDH: 168 U/L (ref 98–192)

## 2021-06-18 LAB — TRIGLYCERIDES: Triglycerides: 71 mg/dL (ref <150)

## 2021-06-18 LAB — C-REACTIVE PROTEIN: CRP: 3.6 mg/dL — ABNORMAL HIGH (ref <1.0)

## 2021-06-18 LAB — MAGNESIUM: Magnesium: 1.9 mg/dL (ref 1.7–2.4)

## 2021-06-18 LAB — RESP PANEL BY RT-PCR (FLU A&B, COVID) ARPGX2
Influenza A by PCR: NEGATIVE
Influenza B by PCR: NEGATIVE
SARS Coronavirus 2 by RT PCR: POSITIVE — AB

## 2021-06-18 LAB — APTT: aPTT: 31 seconds (ref 24–36)

## 2021-06-18 LAB — FIBRINOGEN: Fibrinogen: 363 mg/dL (ref 210–475)

## 2021-06-18 LAB — FERRITIN: Ferritin: 242 ng/mL (ref 24–336)

## 2021-06-18 MED ORDER — IRBESARTAN 150 MG PO TABS
150.0000 mg | ORAL_TABLET | Freq: Every day | ORAL | Status: DC
  Administered 2021-06-20 – 2021-06-23 (×4): 150 mg via ORAL
  Filled 2021-06-18 (×6): qty 1

## 2021-06-18 MED ORDER — ONDANSETRON HCL 4 MG PO TABS: 4.0000 mg | ORAL_TABLET | Freq: Four times a day (QID) | ORAL | Status: DC | PRN

## 2021-06-18 MED ORDER — HYDROCOD POLST-CPM POLST ER 10-8 MG/5ML PO SUER: 5.0000 mL | Freq: Two times a day (BID) | ORAL | Status: DC | PRN

## 2021-06-18 MED ORDER — SODIUM CHLORIDE 0.9 % IV SOLN: INTRAVENOUS | Status: DC

## 2021-06-18 MED ORDER — HYDROCHLOROTHIAZIDE 12.5 MG PO CAPS
12.5000 mg | ORAL_CAPSULE | Freq: Every day | ORAL | Status: DC
  Administered 2021-06-19 – 2021-06-23 (×5): 12.5 mg via ORAL
  Filled 2021-06-18 (×5): qty 1

## 2021-06-18 MED ORDER — ACETAMINOPHEN 500 MG PO TABS
1000.0000 mg | ORAL_TABLET | Freq: Once | ORAL | Status: AC
  Administered 2021-06-18: 1000 mg via ORAL
  Filled 2021-06-18: qty 2

## 2021-06-18 MED ORDER — ENOXAPARIN SODIUM 40 MG/0.4ML IJ SOSY
40.0000 mg | PREFILLED_SYRINGE | INTRAMUSCULAR | Status: DC
  Administered 2021-06-18 – 2021-06-22 (×5): 40 mg via SUBCUTANEOUS
  Filled 2021-06-18 (×5): qty 0.4

## 2021-06-18 MED ORDER — SODIUM CHLORIDE 0.9 % IV SOLN: 100.0000 mg | Freq: Once | INTRAVENOUS | Status: DC

## 2021-06-18 MED ORDER — FAMOTIDINE 20 MG PO TABS
20.0000 mg | ORAL_TABLET | Freq: Two times a day (BID) | ORAL | Status: DC
  Administered 2021-06-18 – 2021-06-23 (×9): 20 mg via ORAL
  Filled 2021-06-18 (×10): qty 1

## 2021-06-18 MED ORDER — SODIUM CHLORIDE 0.9 % IV SOLN: 200.0000 mg | Freq: Once | INTRAVENOUS | Status: DC

## 2021-06-18 MED ORDER — ASPIRIN EC 81 MG PO TBEC
81.0000 mg | DELAYED_RELEASE_TABLET | Freq: Every day | ORAL | Status: DC
  Administered 2021-06-18 – 2021-06-23 (×6): 81 mg via ORAL
  Filled 2021-06-18 (×6): qty 1

## 2021-06-18 MED ORDER — GUAIFENESIN-DM 100-10 MG/5ML PO SYRP: 10.0000 mL | ORAL_SOLUTION | ORAL | Status: DC | PRN

## 2021-06-18 MED ORDER — ENOXAPARIN SODIUM 40 MG/0.4ML IJ SOSY: 40.0000 mg | PREFILLED_SYRINGE | INTRAMUSCULAR | Status: DC

## 2021-06-18 MED ORDER — ALLOPURINOL 100 MG PO TABS
100.0000 mg | ORAL_TABLET | Freq: Every day | ORAL | Status: DC
  Administered 2021-06-20 – 2021-06-23 (×4): 100 mg via ORAL
  Filled 2021-06-18 (×6): qty 1

## 2021-06-18 MED ORDER — VALSARTAN-HYDROCHLOROTHIAZIDE 160-12.5 MG PO TABS: 1.0000 | ORAL_TABLET | Freq: Every day | ORAL | Status: DC

## 2021-06-18 MED ORDER — INSULIN ASPART 100 UNIT/ML IJ SOLN
0.0000 [IU] | Freq: Three times a day (TID) | INTRAMUSCULAR | Status: DC
  Administered 2021-06-18: 2 [IU] via SUBCUTANEOUS
  Filled 2021-06-18: qty 1

## 2021-06-18 MED ORDER — MAGNESIUM HYDROXIDE 400 MG/5ML PO SUSP
30.0000 mL | Freq: Every day | ORAL | Status: DC | PRN
  Filled 2021-06-18: qty 30

## 2021-06-18 MED ORDER — ASCORBIC ACID 500 MG PO TABS
500.0000 mg | ORAL_TABLET | Freq: Every day | ORAL | Status: DC
  Administered 2021-06-18 – 2021-06-23 (×6): 500 mg via ORAL
  Filled 2021-06-18 (×6): qty 1

## 2021-06-18 MED ORDER — ACETAMINOPHEN 325 MG PO TABS: 650.0000 mg | ORAL_TABLET | Freq: Four times a day (QID) | ORAL | Status: DC | PRN

## 2021-06-18 MED ORDER — SIMVASTATIN 10 MG PO TABS: 40.0000 mg | ORAL_TABLET | Freq: Every day | ORAL | Status: DC

## 2021-06-18 MED ORDER — GLIMEPIRIDE 4 MG PO TABS: 4.0000 mg | ORAL_TABLET | Freq: Every day | ORAL | Status: DC

## 2021-06-18 MED ORDER — INSULIN ASPART 100 UNIT/ML IJ SOLN
0.0000 [IU] | Freq: Every day | INTRAMUSCULAR | Status: DC
  Administered 2021-06-19 – 2021-06-22 (×3): 2 [IU] via SUBCUTANEOUS
  Filled 2021-06-18 (×3): qty 1

## 2021-06-18 MED ORDER — SODIUM CHLORIDE 0.9 % IV SOLN
200.0000 mg | Freq: Once | INTRAVENOUS | Status: AC
  Administered 2021-06-18: 200 mg via INTRAVENOUS
  Filled 2021-06-18: qty 40

## 2021-06-18 MED ORDER — SODIUM CHLORIDE 0.9 % IV SOLN
100.0000 mg | Freq: Every day | INTRAVENOUS | Status: AC
  Administered 2021-06-19 – 2021-06-21 (×3): 100 mg via INTRAVENOUS
  Filled 2021-06-18: qty 20
  Filled 2021-06-18: qty 100
  Filled 2021-06-18: qty 20
  Filled 2021-06-18: qty 100

## 2021-06-18 MED ORDER — ONDANSETRON HCL 4 MG/2ML IJ SOLN: 4.0000 mg | Freq: Four times a day (QID) | INTRAMUSCULAR | Status: DC | PRN

## 2021-06-18 MED ORDER — ZINC SULFATE 220 (50 ZN) MG PO CAPS
220.0000 mg | ORAL_CAPSULE | Freq: Every day | ORAL | Status: DC
  Administered 2021-06-18 – 2021-06-23 (×6): 220 mg via ORAL
  Filled 2021-06-18 (×6): qty 1

## 2021-06-18 MED ORDER — COLCHICINE 0.6 MG PO TABS
0.6000 mg | ORAL_TABLET | Freq: Every day | ORAL | Status: DC
  Administered 2021-06-19 – 2021-06-23 (×5): 0.6 mg via ORAL
  Filled 2021-06-18 (×6): qty 1

## 2021-06-18 MED ORDER — LINAGLIPTIN 5 MG PO TABS
5.0000 mg | ORAL_TABLET | Freq: Every day | ORAL | Status: DC
  Administered 2021-06-18 – 2021-06-23 (×5): 5 mg via ORAL
  Filled 2021-06-18 (×7): qty 1

## 2021-06-18 MED ORDER — HALOPERIDOL LACTATE 5 MG/ML IJ SOLN: 1.0000 mg | Freq: Four times a day (QID) | INTRAMUSCULAR | Status: DC | PRN

## 2021-06-18 MED ORDER — INSULIN ASPART 100 UNIT/ML IJ SOLN
0.0000 [IU] | Freq: Three times a day (TID) | INTRAMUSCULAR | Status: DC
  Administered 2021-06-18 (×2): 3 [IU] via SUBCUTANEOUS
  Administered 2021-06-19 (×2): 2 [IU] via SUBCUTANEOUS
  Administered 2021-06-20: 08:00:00 1 [IU] via SUBCUTANEOUS
  Administered 2021-06-20 – 2021-06-22 (×5): 2 [IU] via SUBCUTANEOUS
  Administered 2021-06-22: 1 [IU] via SUBCUTANEOUS
  Administered 2021-06-23: 5 [IU] via SUBCUTANEOUS
  Filled 2021-06-18 (×12): qty 1

## 2021-06-18 MED ORDER — SIMVASTATIN 20 MG PO TABS
40.0000 mg | ORAL_TABLET | Freq: Every day | ORAL | Status: DC
  Administered 2021-06-18 – 2021-06-22 (×4): 40 mg via ORAL
  Filled 2021-06-18: qty 2
  Filled 2021-06-18: qty 4
  Filled 2021-06-18 (×2): qty 2

## 2021-06-18 MED ORDER — SODIUM CHLORIDE 0.9 % IV SOLN: 100.0000 mg | Freq: Every day | INTRAVENOUS | Status: DC

## 2021-06-18 MED ORDER — TRAZODONE HCL 50 MG PO TABS
25.0000 mg | ORAL_TABLET | Freq: Every evening | ORAL | Status: DC | PRN
Start: 2021-06-18 — End: 2021-06-23
  Administered 2021-06-20 – 2021-06-21 (×3): 25 mg via ORAL
  Filled 2021-06-18 (×3): qty 1

## 2021-06-18 MED ORDER — INSULIN ASPART 100 UNIT/ML IJ SOLN
0.0000 [IU] | Freq: Three times a day (TID) | INTRAMUSCULAR | Status: DC
Start: 2021-06-18 — End: 2021-06-18

## 2021-06-18 NOTE — Progress Notes (Signed)
PHARMACY -  BRIEF ANTIBIOTIC NOTE   Pharmacy has received consult(s) for Cefepime and Vancomycin from an ED provider.  The patient's profile has been reviewed for ht/wt/allergies/indication/available labs.    One time order(s) placed for Cefepime 2 gm and Vancomycin 1 gm (no current/recent wt currently available).  Further antibiotics/pharmacy consults should be ordered by admitting physician if indicated.                       Renda Rolls, PharmD, [facility] 06/18/2021 12:17 AM

## 2021-06-18 NOTE — ED Notes (Signed)
Patient assisted to bedside commode. Patients bed linen changed. Pt now resting comfortably. Mitts in place. Bed alarm active and audible.

## 2021-06-18 NOTE — Sepsis Progress Note (Signed)
Following for sepsis monitoring ?

## 2021-06-18 NOTE — ED Notes (Signed)
Pt. Resting, posey bed alarm remains in place and functioning.

## 2021-06-18 NOTE — ED Notes (Signed)
Pt. Found sitting in chair in room, had removed mitts, cardiac leads, and existing IV. Will update MD and seek advise. Pt. Guided back to bed, bedding rearranged, cardiac monitoring and mitts reapplied. Charge RN notified, will send PSA or PCT to sit with pt.

## 2021-06-18 NOTE — ED Notes (Signed)
Patient is resting comfortably. Call light in reach. Fall precautions in place. Bed alarm active and audible. Mitts on.

## 2021-06-18 NOTE — Progress Notes (Signed)
CODE SEPSIS - PHARMACY COMMUNICATION  **Broad Spectrum Antibiotics should be administered within 1 hour of Sepsis diagnosis**  Time Code Sepsis Called/Page Received: 2356  Antibiotics Ordered: Cefepime and Vancomycin  Time of 1st antibiotic administration: 0059  Additional action taken by pharmacy: Haywood Pao, RN regarding code sepsis.  RN busy w/ multiple pts, but will hang Cefepime ASAP.  Will continue to monitor for admin time.  Renda Rolls, PharmD, [facility] 06/18/2021 12:16 AM

## 2021-06-18 NOTE — ED Notes (Signed)
Pt. Resting in bed. Lights dimmed. Denies need at this time

## 2021-06-18 NOTE — Progress Notes (Signed)
Brief hospitalist update note.  This is a nonbillable note.  Please see same-day H&P from Dr. Gayla Medicus for full billable details.  Patient is a 87 year old male who presents for acute encephalopathy presumably due to COVID infection.  COVID appears to be relatively mild with no oxygen requirement, no fevers, no obvious pneumonia noted on x-ray.  Main driver for admission appears to be mental status changes.  At time of my evaluation the patient has mitts in place.  He is resting comfortably.  He is alert but oriented only to person.  Not to time or place.  Patient did not know which city where he went or what year it was.  Baseline mental status is unclear.  Will continue current scope of treatment.  Patient is a DNR  Ralene Muskrat MD  No charge

## 2021-06-18 NOTE — ED Notes (Signed)
In and out cath performed by Blenda Mounts, PCT.

## 2021-06-18 NOTE — Progress Notes (Signed)
Remdesivir - Pharmacy Brief Note   O:  CXR: "No confluent airspace opacities or effusions. Low lung volumes. No acute bony abnormality." SpO2: 84-100% on RA   A/P:  Remdesivir 200 mg IVPB once followed by 100 mg IVPB daily x 4 days.   Renda Rolls, PharmD, [facility] 06/18/2021 4:06 AM

## 2021-06-18 NOTE — ED Notes (Signed)
Pt. Pulled out both his established IV's, and all of his cardiac monitoring leads while cursing. Staff placed mitts on pt. To preserve IV and monitoring, fresh linens and pads applied. Primo fit applied.

## 2021-06-18 NOTE — ED Notes (Signed)
MD ok'd new labs 0330 labs to be drawn with AM lab draws. Lab notified.

## 2021-06-18 NOTE — ED Notes (Signed)
Patient assisted to bedside commode. Bed linen changed and peri care preformed independently. Patient is resting comfortably. Call light in reach. Fall precautions in place. Bed alarm active and audible. Mitts in place.

## 2021-06-18 NOTE — ED Notes (Signed)
PCT sitting at bedside.

## 2021-06-18 NOTE — ED Provider Notes (Signed)
[facility] Emergency Department Provider Note  ____________________________________________   Event Date/Time   First MD Initiated Contact with Patient 06/17/21 2300     (approximate)  I have reviewed the triage vital signs and the nursing notes.   HISTORY  Chief Complaint Altered Mental Status and Weakness (C/o increasing weakness and AMS x1 day. EMS states some family stated symptoms had been increasing x10 days. LKW yesterday per SO. Pt. Denies any pain/injury. Pt is alert and disoriented.)    HPI [name] is a 87 y.o. male with history of hypertension, diabetes who presents to the emergency department with EMS from home for concerns for increasing weakness over the past several days and altered mental status for 1 day.  Most of the history is obtained by patient's partner of 43 years.  He states that patient normally ambulates with a cane but today needed a walker to get around.  He states patient is normally alert, oriented and recently had family visit and was able to have conversation but today was very confused and had difficulty performing his ADLs.  No known falls.  No known fevers, cough, vomiting or diarrhea.  Patient is not vaccinated against COVID-19.  Unclear if he has had a flu vaccination.  Patient's partner confirms that he has a DNR/DNI.  Patient denies any pain at this time.  Temperature 100 with EMS.  Given IV fluids in route.  Blood glucose normal with EMS in the 200s.        Past Medical History:  Diagnosis Date   Actinic keratosis    Basal cell carcinoma 09/21/2016   R shoulder, exc 10/13/2016   Diabetes mellitus without complication ([place])    Hypertension    Squamous cell carcinoma of skin 03/12/2021   L of midline vertex scalp, EDC     Patient Active Problem List   Diagnosis Date Noted   Acute encephalopathy 06/18/2021    No past surgical history on file.  Prior to Admission medications   Medication Sig Start Date  End Date Taking? Authorizing Provider  allopurinol (ZYLOPRIM) 100 MG tablet Take 100 mg by mouth daily.    [provider]  colchicine 0.6 MG tablet Take 0.6 mg by mouth daily.    [provider]  glimepiride (AMARYL) 4 MG tablet Take 4 mg by mouth daily with breakfast.    [provider]  metoprolol tartrate (LOPRESSOR) 50 MG tablet Take 50 mg by mouth 2 (two) times daily.    [provider]  simvastatin (ZOCOR) 40 MG tablet Take 40 mg by mouth daily.    [provider]  traMADol-acetaminophen (ULTRACET) 37.5-325 MG tablet Take 1 tablet by mouth 2 (two) times daily. 1 tablet every 12 hours when necessary pain. Patient not taking: Reported on 03/12/2021 12/10/17   Sable Feil, PA-C  valsartan-hydrochlorothiazide (DIOVAN-HCT) 160-12.5 MG tablet Take 1 tablet by mouth daily.    [provider]    Allergies Patient has no allergy information on record.  No family history on file.  Social History    Review of Systems Level 5 caveat secondary to altered mental status  ____________________________________________   PHYSICAL EXAM:  VITAL SIGNS: ED Triage Vitals [06/17/21 2315]  Enc Vitals Group     BP 125/74     Pulse Rate 63     Resp      Temp 99.1 F (37.3 C)     Temp Source Oral     SpO2 95 %  Weight      Height      Head Circumference      Peak Flow      Pain Score 0     Pain Loc      Pain Edu?      Excl. in [place]?    CONSTITUTIONAL: Alert and oriented to person only.  Elderly.  In no distress. HEAD: Normocephalic, atraumatic EYES: Conjunctivae clear, pupils appear equal, EOM appear intact ENT: normal nose; moist mucous membranes NECK: Supple, normal ROM, no meningismus CARD: RRR; S1 and S2 appreciated; no murmurs, no clicks, no rubs, no gallops RESP: Normal chest excursion without splinting or tachypnea; breath sounds clear and equal bilaterally; no wheezes, no rhonchi, no rales, no hypoxia or respiratory  distress, speaking full sentences ABD/GI: Normal bowel sounds; non-distended; soft, non-tender, no rebound, no guarding, no peritoneal signs, no hepatosplenomegaly BACK: The back appears normal EXT: Normal ROM in all joints; no deformity noted, +1 pitting edema in bilateral lower extremities, no cyanosis, 2+ DP and radial pulses bilaterally SKIN: Normal color for age and race; warm; no rash on exposed skin NEURO: No drift noted to the upper extremities.  No facial asymmetry.  Normal speech.  Unable to lift either leg off of the bed.  Reports normal sensation diffusely.  Able to follow some simple commands. PSYCH: The patient's mood and manner are appropriate.  ____________________________________________   LABS (all labs ordered are listed, but only abnormal results are displayed)  Labs Reviewed  RESP PANEL BY RT-PCR (FLU A&B, COVID) ARPGX2 - Abnormal; Notable for the following components:      Result Value   SARS Coronavirus 2 by RT PCR POSITIVE (*)    All other components within normal limits  COMPREHENSIVE METABOLIC PANEL - Abnormal; Notable for the following components:   Glucose, Bld 238 (*)    Creatinine, Ser 1.34 (*)    Calcium 8.8 (*)    Total Protein 6.4 (*)    Albumin 3.3 (*)    AST 93 (*)    GFR, Estimated 50 (*)    All other components within normal limits  CBC WITH DIFFERENTIAL/PLATELET - Abnormal; Notable for the following components:   Platelets 117 (*)    All other components within normal limits  URINALYSIS, COMPLETE (UACMP) WITH MICROSCOPIC - Abnormal; Notable for the following components:   Color, Urine YELLOW (*)    APPearance CLOUDY (*)    Glucose, UA 150 (*)    Hgb urine dipstick MODERATE (*)    Ketones, ur 5 (*)    Protein, ur 30 (*)    Bacteria, UA RARE (*)    All other components within normal limits  CBG MONITORING, ED - Abnormal; Notable for the following components:   Glucose-Capillary 190 (*)    All other components within normal limits  CULTURE,  BLOOD (ROUTINE X 2)  CULTURE, BLOOD (ROUTINE X 2)  URINE CULTURE  LACTIC ACID, PLASMA  PROTIME-INR  APTT  PROCALCITONIN  MAGNESIUM  LACTATE DEHYDROGENASE  FERRITIN  TRIGLYCERIDES  FIBRINOGEN  C-REACTIVE PROTEIN   ____________________________________________  EKG   EKG Interpretation  Date/Time:  Tuesday June 17 2021 23:19:42 EDT Ventricular Rate:  61 PR Interval:  205 QRS Duration: 81 QT Interval:  419 QTC Calculation: 422 R Axis:   21 Text Interpretation: Sinus rhythm Supraventricular bigeminy Low voltage, extremity and precordial leads Confirmed by Pryor Curia [phone]) on 06/18/2021 12:25:00 AM         ____________________________________________  RADIOLOGY Jessie Foot Luz Mares, personally viewed  and evaluated these images (plain radiographs) as part of my medical decision making, as well as reviewing the written report by the radiologist.  ED MD interpretation: CT head shows no acute abnormality.  Chest x-ray clear.  Official radiology report(s): CT Head Wo Contrast  Result Date: 06/18/2021 CLINICAL DATA:  Altered mental status EXAM: CT HEAD WITHOUT CONTRAST TECHNIQUE: Contiguous axial images were obtained from the base of the skull through the vertex without intravenous contrast. COMPARISON:  None. FINDINGS: Brain: Normal anatomic configuration. Moderate parenchymal volume loss is commensurate with the patient's age. Mild periventricular white matter changes are present likely reflecting the sequela of small vessel ischemia. Remote lacunar infarct is noted within the right insular cortex. No abnormal intra or extra-axial mass lesion or fluid collection. No abnormal mass effect or midline shift. No evidence of acute intracranial hemorrhage or infarct. Ventricular size is normal. Cerebellum unremarkable. Vascular: No asymmetric hyperdense vasculature at the skull base. Skull: Intact Sinuses/Orbits: Paranasal sinuses are clear. Orbits are unremarkable. Other: Mastoid air cells  and middle ear cavities are clear. IMPRESSION: No acute intracranial hemorrhage or infarct. Moderate senescent change.  Remote right insular lacunar infarct. Electronically Signed   By: Fidela Salisbury MD   On: 06/18/2021 00:44   DG Chest Port 1 View  Result Date: 06/18/2021 CLINICAL DATA:  Questionable sepsis EXAM: PORTABLE CHEST 1 VIEW COMPARISON:  None. FINDINGS: Heart is normal size. No confluent airspace opacities or effusions. Low lung volumes. No acute bony abnormality. IMPRESSION: No active disease. Electronically Signed   By: Rolm Baptise M.D.   On: 06/18/2021 00:29    ____________________________________________   PROCEDURES  Procedure(s) performed (including Critical Care):  Procedures   ____________________________________________   INITIAL IMPRESSION / ASSESSMENT AND PLAN / ED COURSE  As part of my medical decision making, I reviewed the following data within the [place] History obtained from family, Nursing notes reviewed and incorporated, Labs reviewed , EKG interpreted , Old EKG reviewed, Old chart reviewed, Radiograph reviewed , Discussed with admitting physician , and Notes from prior ED visits         Patient here with altered mental status.  Found to have oral temperature of 100 with EMS.  Oral temp here is 99.1.  Will obtain rectal temperature.  Concern for possible infectious etiology.  Differential also includes CVA, intracranial hemorrhage, dehydration, anemia, electrolyte derangement.  EKG shows bigeminy but no ischemia.  Family confirms he is a DNR/DNI and would not want invasive procedures but they are okay with work-up in the ED, IV fluids, antibiotics, admission.  ED PROGRESS  Patient's work-up reveals that he is COVID-19 positive.  I suspect encephalopathy secondary to same.  Chest x-ray clear.  He has not had any hypoxia here.  He did have 1 sat of 84% documented but I suspect this was erroneous.  He is currently on room air without  respiratory distress.  Otherwise work-up unrevealing.  Head CT shows no acute abnormality.  Normal lactic and procalcitonin.  Urine shows no infection.  We will stop further IV hydration given COVID-19 status.  Inflammatory markers have been ordered.  We will start remdesivir.  Does not meet criteria for steroids.  I attempted to contact his partner to update him without any answer.  3:47 AM Discussed patient's case with hospitalist, Dr. Sidney Ace.  I have recommended admission and patient (and family if present) agree with this plan. Admitting physician will place admission orders.   I reviewed all nursing notes, vitals, pertinent previous  records and reviewed/interpreted all EKGs, lab and urine results, imaging (as available).  ____________________________________________   FINAL CLINICAL IMPRESSION(S) / ED DIAGNOSES  Final diagnoses:  COVID-19 virus infection  Altered mental status, unspecified altered mental status type  Generalized weakness     ED Discharge Orders     None       *Please note:  [name] was evaluated in Emergency Department on 06/18/2021 for the symptoms described in the history of present illness. He was evaluated in the context of the global COVID-19 pandemic, which necessitated consideration that the patient might be at risk for infection with the SARS-CoV-2 virus that causes COVID-19. Institutional protocols and algorithms that pertain to the evaluation of patients at risk for COVID-19 are in a state of rapid change based on information released by regulatory bodies including the CDC and federal and state organizations. These policies and algorithms were followed during the patient's care in the ED.  Some ED evaluations and interventions may be delayed as a result of limited staffing during and the pandemic.*   Note:  This document was prepared using Dragon voice recognition software and may include unintentional dictation errors.    Annalisia Ingber, Delice Bison,  DO 06/18/21 [phone]

## 2021-06-18 NOTE — H&P (Signed)
[place]   PATIENT NAME: [name]    MR#:  [id number]  DATE OF BIRTH:  09-Apr-1927  DATE OF ADMISSION:  06/17/2021  PRIMARY CARE PHYSICIAN: Idelle Crouch, MD   Patient is coming from: Home  REQUESTING/REFERRING PHYSICIAN: Ward, Delice Bison, DO  CHIEF COMPLAINT:   Chief Complaint  Patient presents with   Altered Mental Status   Weakness    C/o increasing weakness and AMS x1 day. EMS states some family stated symptoms had been increasing x10 days. LKW yesterday per SO. Pt. Denies any pain/injury. Pt is alert and disoriented.    HISTORY OF PRESENT ILLNESS:  [name] is a 87 y.o. Caucasian male with medical history significant for type 2 diabetes mellitus and hypertension who presented to the emergency room with acute onset of altered mental status since yesterday with associated generalized significant weakness.  [name] denies any nausea or vomiting or diarrhea.  No cough or wheezing or dyspnea.  No loss of taste or smell.  [name] denied being vaccinated for COVID-19.  No chest pain or palpitations.  No dysuria oliguria or hematuria or flank pain.  ED Course: Upon presentation to the emergency room, temperature was 99.1 and vital signs otherwise were within normal.  Later on temperature was up to 101.3.  Labs revealed a blood glucose of 238, BUN of 32 and creatinine 1.34 close to his baseline.  Albumin was 3.3.  Lactic acid was 1.7 and procalcitonin less than 0.1.  CBC was remarkable for thrombocytopenia 117.  Influenza antigens came back negative and COVID-19 came back positive.  UA was unremarkable.  Blood cultures and urine culture were sent.  EKG as reviewed by me : Showed normal sinus rhythm with a rate of 61 with supraventricular bigeminy and low voltage QRS Imaging: Portable chest x-ray showed no acute cardiopulmonary disease.  Noncontrasted head CT scan revealed moderate senescent change and remote right insular lacunar infarct with no acute intracranial hemorrhage  or infarct.  The patient was given IV cefepime and vancomycin initially and later on was given IV remdesivir and 1 g of p.o. Tylenol.  [name] will be admitted to a medical monitored bed for further evaluation and management. PAST MEDICAL HISTORY:   Past Medical History:  Diagnosis Date   Actinic keratosis    Basal cell carcinoma 09/21/2016   R shoulder, exc 10/13/2016   Diabetes mellitus without complication ([place])    Hypertension    Squamous cell carcinoma of skin 03/12/2021   L of midline vertex scalp, [facility]     PAST SURGICAL HISTORY:  No past surgical history on file.  [name] denies any previous surgeries.  SOCIAL HISTORY:   Social History   Tobacco Use   Smoking status: Not on file   Smokeless tobacco: Not on file  Substance Use Topics   Alcohol use: Not on file  No history of tobacco EtOH abuse or illicit drug use.  FAMILY HISTORY:   Positive for hypertension.  DRUG ALLERGIES:  Not on File no known drug allergies.  REVIEW OF SYSTEMS:   ROS As per history of present illness. All pertinent systems were reviewed above. Constitutional, HEENT, cardiovascular, respiratory, GI, GU, musculoskeletal, neuro, psychiatric, endocrine, integumentary and hematologic systems were reviewed and are otherwise negative/unremarkable except for positive findings mentioned above in the HPI.   MEDICATIONS AT HOME:   Prior to Admission medications   Medication Sig Start Date End Date Taking? Authorizing Provider  allopurinol (ZYLOPRIM) 100 MG tablet Take 100  mg by mouth daily.    [provider]  colchicine 0.6 MG tablet Take 0.6 mg by mouth daily.    [provider]  glimepiride (AMARYL) 4 MG tablet Take 4 mg by mouth daily with breakfast.    [provider]  metoprolol tartrate (LOPRESSOR) 50 MG tablet Take 50 mg by mouth 2 (two) times daily.    [provider]  simvastatin (ZOCOR) 40 MG tablet Take 40 mg by mouth daily.    [provider]   traMADol-acetaminophen (ULTRACET) 37.5-325 MG tablet Take 1 tablet by mouth 2 (two) times daily. 1 tablet every 12 hours when necessary pain. Patient not taking: Reported on 03/12/2021 12/10/17   Sable Feil, PA-C  valsartan-hydrochlorothiazide (DIOVAN-HCT) 160-12.5 MG tablet Take 1 tablet by mouth daily.    [provider]      VITAL SIGNS:  Blood pressure 120/64, pulse (!) 55, temperature 99.1 F (37.3 C), temperature source Oral, resp. rate 20, weight 97.5 kg, SpO2 98 %.  PHYSICAL EXAMINATION:  Physical Exam  GENERAL:  87 y.o.-year-old Caucasian male patient lying in the bed with no acute distress.  [name] is alert but disoriented and globally confused. EYES: Pupils equal, round, reactive to light and accommodation. No scleral icterus. Extraocular muscles intact.  HEENT: Head atraumatic, normocephalic. Oropharynx and nasopharynx clear.  NECK:  Supple, no jugular venous distention. No thyroid enlargement, no tenderness.  LUNGS: Normal breath sounds bilaterally, no wheezing, rales,rhonchi or crepitation. No use of accessory muscles of respiration.  CARDIOVASCULAR: Regular rate and rhythm, S1, S2 normal. No murmurs, rubs, or gallops.  ABDOMEN: Soft, nondistended, nontender. Bowel sounds present. No organomegaly or mass.  EXTREMITIES: No pedal edema, cyanosis, or clubbing.  NEUROLOGIC: Cranial nerves II through XII are intact. Muscle strength 5/5 in all extremities. Sensation intact. Gait not checked.  PSYCHIATRIC: [name] is alert but disoriented and globally confused..  Normal affect and good eye contact. SKIN: No obvious rash, lesion, or ulcer.   LABORATORY PANEL:   CBC Recent Labs  Lab 06/17/21 2334  WBC 5.6  HGB 16.0  HCT 47.4  PLT 117*   ------------------------------------------------------------------------------------------------------------------  Chemistries  Recent Labs  Lab 06/17/21 2334  NA 137  K 3.9  CL 103  CO2 25  GLUCOSE 238*  BUN 22  CREATININE  1.34*  CALCIUM 8.8*  MG 1.9  AST 93*  ALT 22  ALKPHOS 42  BILITOT 0.7   ------------------------------------------------------------------------------------------------------------------  Cardiac Enzymes No results for input(s): TROPONINI in the last 168 hours. ------------------------------------------------------------------------------------------------------------------  RADIOLOGY:  CT Head Wo Contrast  Result Date: 06/18/2021 CLINICAL DATA:  Altered mental status EXAM: CT HEAD WITHOUT CONTRAST TECHNIQUE: Contiguous axial images were obtained from the base of the skull through the vertex without intravenous contrast. COMPARISON:  None. FINDINGS: Brain: Normal anatomic configuration. Moderate parenchymal volume loss is commensurate with the patient's age. Mild periventricular white matter changes are present likely reflecting the sequela of small vessel ischemia. Remote lacunar infarct is noted within the right insular cortex. No abnormal intra or extra-axial mass lesion or fluid collection. No abnormal mass effect or midline shift. No evidence of acute intracranial hemorrhage or infarct. Ventricular size is normal. Cerebellum unremarkable. Vascular: No asymmetric hyperdense vasculature at the skull base. Skull: Intact Sinuses/Orbits: Paranasal sinuses are clear. Orbits are unremarkable. Other: Mastoid air cells and middle ear cavities are clear. IMPRESSION: No acute intracranial hemorrhage or infarct. Moderate senescent change.  Remote right insular lacunar infarct. Electronically Signed   By: Fidela Salisbury MD  On: 06/18/2021 00:44   DG Chest Port 1 View  Result Date: 06/18/2021 CLINICAL DATA:  Questionable sepsis EXAM: PORTABLE CHEST 1 VIEW COMPARISON:  None. FINDINGS: Heart is normal size. No confluent airspace opacities or effusions. Low lung volumes. No acute bony abnormality. IMPRESSION: No active disease. Electronically Signed   By: Rolm Baptise M.D.   On: 06/18/2021 00:29       IMPRESSION AND PLAN:  Active Problems:   Acute encephalopathy  1.  COVID-19 infection with associated altered mental status possibly related to acute encephalopathy secondarily as well as generalized weakness. - The patient will be admitted to a medical monitored bed. - We will place him on IV remdesivir. - [name] will be hydrated with IV normal saline. - We will place him on vitamin C, vitamin D3, zinc sulfate and aspirin. - We will follow his blood cultures. - I will hold off on any further IV antibiotic therapy as his procalcitonin was negative. - We will follow his inflammatory markers. - At this time is not hypoxic to require IV steroid therapy.  2.  Mildly uncontrolled type II Diabetes mellitus with mild hyperglycemia. - We will continue his Amaryl and place him on supplement coverage with NovoLog.  3.  Dyslipidemia. - We will continue statin therapy.  4.  Gout. - We will continue his allopurinol and colchicine.  5.  Essential hypertension. - We will continue his Diovan HCT.  6.  Coronary artery disease. - We will continue his beta-blocker therapy with Lopressor as well as aspirin and statin therapy.  DVT prophylaxis: Lovenox. Code Status: The patient is DNR/DNI. Family Communication:  The plan of care was discussed in details with the patient (and family). I answered all questions. The patient agreed to proceed with the above mentioned plan. Further management will depend upon hospital course. Disposition Plan: Back to previous home environment Consults called: none. All the records are reviewed and case discussed with ED provider.  Status is: Inpatient  Remains inpatient appropriate because:Altered mental status, Ongoing diagnostic testing needed not appropriate for outpatient work up, Unsafe d/c plan, IV treatments appropriate due to intensity of illness or inability to take PO, and Inpatient level of care appropriate due to severity of illness  Dispo: The  patient is from: Home              Anticipated d/c is to: Home              Patient currently is not medically stable to d/c.   Difficult to place patient No   TOTAL TIME TAKING CARE OF THIS PATIENT: 55 minutes.    Christel Mormon M.D on 06/18/2021 at 5:56 AM  [facility]   From 7 PM-7 AM, contact night-coverage [email or web address]  CC: Primary care physician; Idelle Crouch, MD

## 2021-06-18 NOTE — ED Notes (Signed)
Patient is resting comfortably. Call light in reach. Fall precautions in place. Bed alarm active and audible. Mitts in place.

## 2021-06-18 NOTE — ED Notes (Signed)
Mits placed on pt. Hand to prevent pulling off cardiac leads, and removing IV's

## 2021-06-19 ENCOUNTER — Other Ambulatory Visit: Payer: Self-pay

## 2021-06-19 ENCOUNTER — Ambulatory Visit: Payer: Medicare Other | Admitting: Dermatology

## 2021-06-19 ENCOUNTER — Encounter: Payer: Self-pay | Admitting: Family Medicine

## 2021-06-19 LAB — CBC WITH DIFFERENTIAL/PLATELET
Abs Immature Granulocytes: 0.01 10*3/uL (ref 0.00–0.07)
Basophils Absolute: 0 10*3/uL (ref 0.0–0.1)
Basophils Relative: 1 %
Eosinophils Absolute: 0.1 10*3/uL (ref 0.0–0.5)
Eosinophils Relative: 1 %
HCT: 45.6 % (ref 39.0–52.0)
Hemoglobin: 15.8 g/dL (ref 13.0–17.0)
Immature Granulocytes: 0 %
Lymphocytes Relative: 37 %
Lymphs Abs: 2.1 10*3/uL (ref 0.7–4.0)
MCH: 30.7 pg (ref 26.0–34.0)
MCHC: 34.6 g/dL (ref 30.0–36.0)
MCV: 88.5 fL (ref 80.0–100.0)
Monocytes Absolute: 0.7 10*3/uL (ref 0.1–1.0)
Monocytes Relative: 12 %
Neutro Abs: 2.8 10*3/uL (ref 1.7–7.7)
Neutrophils Relative %: 49 %
Platelets: 113 10*3/uL — ABNORMAL LOW (ref 150–400)
RBC: 5.15 MIL/uL (ref 4.22–5.81)
RDW: 12.8 % (ref 11.5–15.5)
WBC: 5.7 10*3/uL (ref 4.0–10.5)
nRBC: 0 % (ref 0.0–0.2)

## 2021-06-19 LAB — CBG MONITORING, ED
Glucose-Capillary: 142 mg/dL — ABNORMAL HIGH (ref 70–99)
Glucose-Capillary: 161 mg/dL — ABNORMAL HIGH (ref 70–99)

## 2021-06-19 LAB — COMPREHENSIVE METABOLIC PANEL
ALT: 22 U/L (ref 0–44)
AST: 75 U/L — ABNORMAL HIGH (ref 15–41)
Albumin: 2.9 g/dL — ABNORMAL LOW (ref 3.5–5.0)
Alkaline Phosphatase: 39 U/L (ref 38–126)
Anion gap: 9 (ref 5–15)
BUN: 27 mg/dL — ABNORMAL HIGH (ref 8–23)
CO2: 24 mmol/L (ref 22–32)
Calcium: 8.8 mg/dL — ABNORMAL LOW (ref 8.9–10.3)
Chloride: 107 mmol/L (ref 98–111)
Creatinine, Ser: 1.15 mg/dL (ref 0.61–1.24)
GFR, Estimated: 60 mL/min — ABNORMAL LOW (ref >60)
Glucose, Bld: 157 mg/dL — ABNORMAL HIGH (ref 70–99)
Potassium: 4.1 mmol/L (ref 3.5–5.1)
Sodium: 140 mmol/L (ref 135–145)
Total Bilirubin: 0.7 mg/dL (ref 0.3–1.2)
Total Protein: 5.7 g/dL — ABNORMAL LOW (ref 6.5–8.1)

## 2021-06-19 LAB — URINE CULTURE: Culture: NO GROWTH

## 2021-06-19 LAB — GLUCOSE, CAPILLARY
Glucose-Capillary: 167 mg/dL — ABNORMAL HIGH (ref 70–99)
Glucose-Capillary: 235 mg/dL — ABNORMAL HIGH (ref 70–99)

## 2021-06-19 LAB — HEMOGLOBIN A1C
Hgb A1c MFr Bld: 12.8 % — ABNORMAL HIGH (ref 4.8–5.6)
Mean Plasma Glucose: 321 mg/dL

## 2021-06-19 LAB — C-REACTIVE PROTEIN: CRP: 4.4 mg/dL — ABNORMAL HIGH (ref <1.0)

## 2021-06-19 MED ORDER — MAGNESIUM SULFATE 2 GM/50ML IV SOLN
2.0000 g | Freq: Once | INTRAVENOUS | Status: AC
  Administered 2021-06-19: 2 g via INTRAVENOUS
  Filled 2021-06-19: qty 50

## 2021-06-19 MED ORDER — POTASSIUM CHLORIDE 20 MEQ PO PACK
40.0000 meq | PACK | Freq: Once | ORAL | Status: AC
  Administered 2021-06-19: 40 meq via ORAL
  Filled 2021-06-19: qty 2

## 2021-06-19 MED ORDER — PNEUMOCOCCAL VAC POLYVALENT 25 MCG/0.5ML IJ INJ
0.5000 mL | INJECTION | INTRAMUSCULAR | Status: AC
  Administered 2021-06-23: 10:00:00 0.5 mL via INTRAMUSCULAR
  Filled 2021-06-19 (×2): qty 0.5

## 2021-06-19 MED ORDER — LORAZEPAM 2 MG/ML IJ SOLN
0.5000 mg | INTRAMUSCULAR | Status: DC | PRN
  Administered 2021-06-20 (×2): 0.5 mg via INTRAVENOUS
  Filled 2021-06-19 (×3): qty 1

## 2021-06-19 NOTE — ED Notes (Addendum)
Dr. Sidney Ace notified pt. Was flopping between bigeminy, and NSR. Will continue to monitor.

## 2021-06-19 NOTE — ED Notes (Signed)
Request made for transport to the floor ?

## 2021-06-19 NOTE — ED Notes (Signed)
Pt. Placed on 12 lead cardiac monitoring

## 2021-06-19 NOTE — ED Notes (Signed)
Dr. Sidney Ace notified pt. Had 10 minute run of HR in 150's, and pt's BP dropped at the same time. Pt. Asymptomatic, and HR and BP recovered shortly after. Will continue to monitor.

## 2021-06-19 NOTE — ED Notes (Signed)
Sent msg to pharmacy for missing allopurinol, linagliptin and irbesartan.

## 2021-06-19 NOTE — ED Notes (Signed)
CBG is 142. Pt resting in bed.

## 2021-06-19 NOTE — Progress Notes (Signed)
Chaplain made initial visitation to meet patient per OR noting patient requested prayer. Patient was cordial yet confused. He said he did not request prayer, but shared prayer is a significant practice in his life. He was enthused to meet a Clinical biochemist. Chaplain made room for listening and empathy. Patient expressed discomfort in the medical tubes and devices attached to his body. He stated that he feels healthy and wants to leave the hospital because he has a social engagement to attend. Chaplain available for continued support as needed.

## 2021-06-19 NOTE — ED Notes (Signed)
This nurse and second nurse helped pt to toilet for BM. Pt on toilet.

## 2021-06-19 NOTE — ED Notes (Signed)
Called telemonitor to inform pt about to go up to floor.

## 2021-06-19 NOTE — Progress Notes (Addendum)
PROGRESS NOTE    [name]  YDX:[id number] DOB: 1927-07-01 DOA: 06/17/2021 PCP: Idelle Crouch, MD    Brief Narrative:  87 year old male who presents for acute encephalopathy presumably due to COVID infection.  COVID appears to be relatively mild with no oxygen requirement, no fevers, no obvious pneumonia noted on x-ray.  Main driver for admission appears to be mental status changes.  At time of my evaluation the patient has mitts in place.  He is resting comfortably.  He is alert but oriented only to person.  Not to time or place.  Patient did not know which city where he went or what year it was.  Baseline mental status is unclear.  Every 24 hours mental status appears to be consistent with admission.  Patient remains compliant however has been noted by nursing staff to remove several IVs and thus has soft mitts in place.   Assessment & Plan:   Active Problems:   Acute encephalopathy  Acute COVID-19 infection Acute metabolic encephalopathy Generalized weakness Patient's vaccination status is unknown He is unable to tell me He appears to see Dr. Doy Hutching and [facility] however last clinic visit was several years ago Patient is alert and oriented to person only Plan: Continue remdesivir for now No indication for steroids Frequent reorienting measures Supportive care Will consult TOC  Diabetes mellitus type 2 with hyperglycemia Amaryl held Sliding scale with NovoLog Carb modified diet  Hyperlipidemia PTA statin  History of gout No evidence of exacerbation Continue allopurinol and colchicine  Essential hypertension Continue Diovan and Lopressor  History of coronary artery disease Continue PTA beta-blocker, aspirin, statin    DVT prophylaxis: Lovenox Code Status: DNR Family Communication:  Anselmo Rod [phone].  Disposition Plan: Status is: Inpatient  Remains inpatient appropriate because:Inpatient level of care appropriate due to severity  of illness  Dispo: The patient is from: Home              Anticipated d/c is to:  TBD              Patient currently not medically stable for discharge   Difficult to place patient No  COVID-19 infection with associated acute encephalopathy.  Baseline level of mentation is unclear.  Disposition plan is unclear.     Level of care: Med-Surg  Consultants:  None  Procedures:  None  Antimicrobials:  Remdesivir   Subjective: Patient seen and examined.  Pleasant and answers questions appropriately however oriented to person only.  Request to take mitts off.  No other complaints  Objective: Vitals:   06/19/21 0830 06/19/21 1100 06/19/21 1200 06/19/21 1300  BP: 106/66 113/66 128/75 131/76  Pulse: 63 63  67  Resp: 18 19 13 18   Temp:    98.4 F (36.9 C)  TempSrc:    Oral  SpO2: 96% 97% 96% 96%  Weight:      Height:        Intake/Output Summary (Last 24 hours) at 06/19/2021 1309 Last data filed at 06/19/2021 1030 Gross per 24 hour  Intake 150 ml  Output 600 ml  Net -450 ml   Filed Weights   06/17/21 2315 06/18/21 1525  Weight: 97.5 kg 95 kg    Examination:  General exam: No acute distress.  Confused Respiratory system: Lungs clear.  Normal work of breathing.  Room air Cardiovascular system: S1-S2, regular rate and rhythm, no murmurs  gastrointestinal system: Abdomen is nondistended, soft and nontender. No organomegaly or masses felt. Normal bowel sounds heard. Central  nervous system: Alert, oriented to person only.  No focal deficits Extremities: Symmetric 5 x 5 power. Skin: No rashes, lesions or ulcers Psychiatry: Judgement and insight appear impaired. Mood & affect flattened.     Data Reviewed: I have personally reviewed following labs and imaging studies  CBC: Recent Labs  Lab 06/17/21 2334 06/19/21 0645  WBC 5.6 5.7  NEUTROABS 3.0 2.8  HGB 16.0 15.8  HCT 47.4 45.6  MCV 91.2 88.5  PLT 117* 643*   Basic Metabolic Panel: Recent Labs  Lab  06/17/21 2334 06/19/21 0645  NA 137 140  K 3.9 4.1  CL 103 107  CO2 25 24  GLUCOSE 238* 157*  BUN 22 27*  CREATININE 1.34* 1.15  CALCIUM 8.8* 8.8*  MG 1.9  --    GFR: Estimated Creatinine Clearance: 45.8 mL/min (by C-G formula based on SCr of 1.15 mg/dL). Liver Function Tests: Recent Labs  Lab 06/17/21 2334 06/19/21 0645  AST 93* 75*  ALT 22 22  ALKPHOS 42 39  BILITOT 0.7 0.7  PROT 6.4* 5.7*  ALBUMIN 3.3* 2.9*   No results for input(s): LIPASE, AMYLASE in the last 168 hours. No results for input(s): AMMONIA in the last 168 hours. Coagulation Profile: Recent Labs  Lab 06/17/21 2334  INR 1.0   Cardiac Enzymes: No results for input(s): CKTOTAL, CKMB, CKMBINDEX, TROPONINI in the last 168 hours. BNP (last 3 results) No results for input(s): PROBNP in the last 8760 hours. HbA1C: Recent Labs    06/17/21 2334  HGBA1C 12.8*   CBG: Recent Labs  Lab 06/18/21 1146 06/18/21 1630 06/18/21 2200 06/19/21 0744 06/19/21 1153  GLUCAP 233* 218* 157* 142* 161*   Lipid Profile: Recent Labs    06/18/21 0744  TRIG 71   Thyroid Function Tests: No results for input(s): TSH, T4TOTAL, FREET4, T3FREE, THYROIDAB in the last 72 hours. Anemia Panel: Recent Labs    06/18/21 0744  FERRITIN 242   Sepsis Labs: Recent Labs  Lab 06/17/21 2334  PROCALCITON <0.10  LATICACIDVEN 1.7    Recent Results (from the past 240 hour(s))  Blood Culture (routine x 2)     Status: None (Preliminary result)   Collection Time: 06/17/21 11:34 PM   Specimen: BLOOD  Result Value Ref Range Status   Specimen Description BLOOD RIGHT ANTECUBITAL  Final   Special Requests   Final    BOTTLES DRAWN AEROBIC AND ANAEROBIC Blood Culture adequate volume   Culture   Final    NO GROWTH 1 DAY Performed at [facility], [address] [place], [place] [address]    Report Status PENDING  Incomplete  Resp Panel by RT-PCR (Flu A&B, Covid) Nasopharyngeal Swab     Status: Abnormal   Collection  Time: 06/18/21  1:21 AM   Specimen: Nasopharyngeal Swab; Nasopharyngeal(NP) swabs in vial transport medium  Result Value Ref Range Status   SARS Coronavirus 2 by RT PCR POSITIVE (A) NEGATIVE Final    Comment: RESULT CALLED TO, READ BACK BY AND VERIFIED WITH: Ronn Melena RN [phone] 06/18/21 HNM (NOTE) SARS-CoV-2 target nucleic acids are DETECTED.  The SARS-CoV-2 RNA is generally detectable in upper respiratory specimens during the acute phase of infection. Positive results are indicative of the presence of the identified virus, but do not rule out bacterial infection or co-infection with other pathogens not detected by the test. Clinical correlation with patient history and other diagnostic information is necessary to determine patient infection status. The expected result is Negative.  Fact Sheet for Patients:  EntrepreneurPulse.com.au  Fact Sheet for Healthcare Providers: IncredibleEmployment.be  This test is not yet approved or cleared by the [place] FDA and  has been authorized for detection and/or diagnosis of SARS-CoV-2 by FDA under an Emergency Use Authorization (EUA).  This EUA will remain in effect (meaning this test can  be used) for the duration of  the COVID-19 declaration under Section 564(b)(1) of the Act, 21 U.S.C. section 360bbb-3(b)(1), unless the authorization is terminated or revoked sooner.     Influenza A by PCR NEGATIVE NEGATIVE Final   Influenza B by PCR NEGATIVE NEGATIVE Final    Comment: (NOTE) The Xpert Xpress SARS-CoV-2/FLU/RSV plus assay is intended as an aid in the diagnosis of influenza from Nasopharyngeal swab specimens and should not be used as a sole basis for treatment. Nasal washings and aspirates are unacceptable for Xpert Xpress SARS-CoV-2/FLU/RSV testing.  Fact Sheet for Patients: EntrepreneurPulse.com.au  Fact Sheet for Healthcare  Providers: IncredibleEmployment.be  This test is not yet approved or cleared by the [place] FDA and has been authorized for detection and/or diagnosis of SARS-CoV-2 by FDA under an Emergency Use Authorization (EUA). This EUA will remain in effect (meaning this test can be used) for the duration of the COVID-19 declaration under Section 564(b)(1) of the Act, 21 U.S.C. section 360bbb-3(b)(1), unless the authorization is terminated or revoked.  Performed at [facility], [address] [place], [place] [address]   Blood Culture (routine x 2)     Status: None (Preliminary result)   Collection Time: 06/18/21  1:21 AM   Specimen: BLOOD  Result Value Ref Range Status   Specimen Description BLOOD RIGHT WRIST  Final   Special Requests   Final    BOTTLES DRAWN AEROBIC AND ANAEROBIC Blood Culture results may not be optimal due to an inadequate volume of blood received in culture bottles   Culture   Final    NO GROWTH 1 DAY Performed at [facility], [address], [place], [place] [address]    Report Status PENDING  Incomplete  Urine culture     Status: None   Collection Time: 06/18/21  1:21 AM   Specimen: In/Out Cath Urine  Result Value Ref Range Status   Specimen Description   Final    IN/OUT CATH URINE Performed at [facility], [address] [place], [place] [address]    Special Requests   Final    NONE Performed at [facility] [address], [place], [place] [address]    Culture   Final    NO GROWTH Performed at [facility], [place] [address] [place], [place] [address]    Report Status 06/19/2021 FINAL  Final         Radiology Studies: CT Head Wo Contrast  Result Date: 06/18/2021 CLINICAL DATA:  Altered mental status EXAM: CT HEAD WITHOUT CONTRAST TECHNIQUE: Contiguous axial images were obtained from the base of the skull through the vertex without intravenous contrast. COMPARISON:  None.  FINDINGS: Brain: Normal anatomic configuration. Moderate parenchymal volume loss is commensurate with the patient's age. Mild periventricular white matter changes are present likely reflecting the sequela of small vessel ischemia. Remote lacunar infarct is noted within the right insular cortex. No abnormal intra or extra-axial mass lesion or fluid collection. No abnormal mass effect or midline shift. No evidence of acute intracranial hemorrhage or infarct. Ventricular size is normal. Cerebellum unremarkable. Vascular: No asymmetric hyperdense vasculature at the skull base. Skull: Intact Sinuses/Orbits: Paranasal sinuses are  clear. Orbits are unremarkable. Other: Mastoid air cells and middle ear cavities are clear. IMPRESSION: No acute intracranial hemorrhage or infarct. Moderate senescent change.  Remote right insular lacunar infarct. Electronically Signed   By: Fidela Salisbury MD   On: 06/18/2021 00:44   DG Chest Port 1 View  Result Date: 06/18/2021 CLINICAL DATA:  Questionable sepsis EXAM: PORTABLE CHEST 1 VIEW COMPARISON:  None. FINDINGS: Heart is normal size. No confluent airspace opacities or effusions. Low lung volumes. No acute bony abnormality. IMPRESSION: No active disease. Electronically Signed   By: Rolm Baptise M.D.   On: 06/18/2021 00:29        Scheduled Meds:  allopurinol  100 mg Oral Daily   vitamin C  500 mg Oral Daily   aspirin EC  81 mg Oral Daily   colchicine  0.6 mg Oral Daily   enoxaparin (LOVENOX) injection  40 mg Subcutaneous Q24H   famotidine  20 mg Oral BID   irbesartan  150 mg Oral Daily   And   hydrochlorothiazide  12.5 mg Oral Daily   insulin aspart  0-5 Units Subcutaneous QHS   insulin aspart  0-9 Units Subcutaneous TID WC   linagliptin  5 mg Oral Daily   simvastatin  40 mg Oral q1800   zinc sulfate  220 mg Oral Daily   Continuous Infusions:  remdesivir 100 mg in NS 100 mL Stopped (06/19/21 1030)     LOS: 1 day    Time spent: 25 minutes    Sidney Ace, MD [facility] Pager [phone]  If 7PM-7AM, please contact night-coverage 06/19/2021, 1:09 PM

## 2021-06-19 NOTE — ED Notes (Signed)
cbg 161. Pt refuses food.

## 2021-06-19 NOTE — ED Notes (Signed)
Pt back in bed.

## 2021-06-19 NOTE — ED Notes (Signed)
Dr Priscella Mann at bedside a few minutes ago. Pt requesting to remove soft mitts. Pt oriented to self only.

## 2021-06-19 NOTE — ED Notes (Signed)
Transport called to bring pt to floor.  

## 2021-06-19 NOTE — ED Notes (Signed)
Soft mitts reapplied.

## 2021-06-19 NOTE — ED Notes (Signed)
Pt ripped out IV line. Pt also removed soft mitts. Will leave mitts off for a little while before IV line is reinserted. Pt repositioned in bed.

## 2021-06-20 DIAGNOSIS — Z66 Do not resuscitate: Secondary | ICD-10-CM

## 2021-06-20 DIAGNOSIS — Z7189 Other specified counseling: Secondary | ICD-10-CM

## 2021-06-20 DIAGNOSIS — Z515 Encounter for palliative care: Secondary | ICD-10-CM

## 2021-06-20 LAB — CBC WITH DIFFERENTIAL/PLATELET
Abs Immature Granulocytes: 0.02 10*3/uL (ref 0.00–0.07)
Basophils Absolute: 0 10*3/uL (ref 0.0–0.1)
Basophils Relative: 0 %
Eosinophils Absolute: 0.1 10*3/uL (ref 0.0–0.5)
Eosinophils Relative: 1 %
HCT: 45.3 % (ref 39.0–52.0)
Hemoglobin: 15.7 g/dL (ref 13.0–17.0)
Immature Granulocytes: 0 %
Lymphocytes Relative: 37 %
Lymphs Abs: 2 10*3/uL (ref 0.7–4.0)
MCH: 30.4 pg (ref 26.0–34.0)
MCHC: 34.7 g/dL (ref 30.0–36.0)
MCV: 87.8 fL (ref 80.0–100.0)
Monocytes Absolute: 0.7 10*3/uL (ref 0.1–1.0)
Monocytes Relative: 12 %
Neutro Abs: 2.7 10*3/uL (ref 1.7–7.7)
Neutrophils Relative %: 50 %
Platelets: 115 10*3/uL — ABNORMAL LOW (ref 150–400)
RBC: 5.16 MIL/uL (ref 4.22–5.81)
RDW: 12.7 % (ref 11.5–15.5)
WBC: 5.5 10*3/uL (ref 4.0–10.5)
nRBC: 0 % (ref 0.0–0.2)

## 2021-06-20 LAB — COMPREHENSIVE METABOLIC PANEL
ALT: 23 U/L (ref 0–44)
AST: 78 U/L — ABNORMAL HIGH (ref 15–41)
Albumin: 3.2 g/dL — ABNORMAL LOW (ref 3.5–5.0)
Alkaline Phosphatase: 40 U/L (ref 38–126)
Anion gap: 8 (ref 5–15)
BUN: 27 mg/dL — ABNORMAL HIGH (ref 8–23)
CO2: 25 mmol/L (ref 22–32)
Calcium: 8.9 mg/dL (ref 8.9–10.3)
Chloride: 108 mmol/L (ref 98–111)
Creatinine, Ser: 1.11 mg/dL (ref 0.61–1.24)
GFR, Estimated: >60 mL/min (ref >60)
Glucose, Bld: 126 mg/dL — ABNORMAL HIGH (ref 70–99)
Potassium: 3.9 mmol/L (ref 3.5–5.1)
Sodium: 141 mmol/L (ref 135–145)
Total Bilirubin: 0.8 mg/dL (ref 0.3–1.2)
Total Protein: 6 g/dL — ABNORMAL LOW (ref 6.5–8.1)

## 2021-06-20 LAB — GLUCOSE, CAPILLARY
Glucose-Capillary: 118 mg/dL — ABNORMAL HIGH (ref 70–99)
Glucose-Capillary: 124 mg/dL — ABNORMAL HIGH (ref 70–99)
Glucose-Capillary: 164 mg/dL — ABNORMAL HIGH (ref 70–99)
Glucose-Capillary: 118 mg/dL — ABNORMAL HIGH (ref 70–99)
Glucose-Capillary: 244 mg/dL — ABNORMAL HIGH (ref 70–99)

## 2021-06-20 LAB — C-REACTIVE PROTEIN: CRP: 4.5 mg/dL — ABNORMAL HIGH (ref <1.0)

## 2021-06-20 MED ORDER — ENSURE ENLIVE PO LIQD
237.0000 mL | Freq: Three times a day (TID) | ORAL | Status: DC
  Administered 2021-06-20 – 2021-06-22 (×5): 237 mL via ORAL

## 2021-06-20 MED ORDER — ADULT MULTIVITAMIN W/MINERALS CH
1.0000 | ORAL_TABLET | Freq: Every day | ORAL | Status: DC
  Administered 2021-06-20 – 2021-06-23 (×4): 1 via ORAL
  Filled 2021-06-20 (×4): qty 1

## 2021-06-20 NOTE — Consult Note (Signed)
Palliative Care Consult Note                                  Date: 06/20/2021   Patient Name: [name]  DOB: 1927-02-04  MRN: [id number]  Age / Sex: 87 y.o., male  PCP: Idelle Crouch, MD Referring Physician: Sidney Ace, MD  Reason for Consultation: Establishing goals of care  HPI/Patient Profile: Palliative Care consult requested for goals of care discussion in this 87 y.o. male  with past medical history of hypertension, diabetes type 2, squamous cell carcinoma of the scalp, and basal cell carcinoma of the shoulder.  He was admitted on 06/17/2021 after presenting to the ED via EMS from home with complaints of generalized weakness and altered mental status.  During work-up patient found to be COVID 19 positive.  He is unvaccinated.  Patient started on IV antibiotics and remdesivir.  Past Medical History:  Diagnosis Date   Actinic keratosis    Basal cell carcinoma 09/21/2016   R shoulder, exc 10/13/2016   Diabetes mellitus without complication ([place])    Hypertension    Squamous cell carcinoma of skin 03/12/2021   L of midline vertex scalp, [facility]     History reviewed. No pertinent family history.  Subjective:   This NP Osborne Oman reviewed medical records, received report from team, assessed the patient and then meet at the patient's bedside his husband Marvetta Gibbons to discuss diagnosis, prognosis, GOC, EOL wishes disposition and options.  Patient remains confused.  Unable to engage in goals of care discussions.    Concept of Palliative Care was introduced as specialized medical care for people and their families living with serious illness.  It focuses on providing relief from the symptoms and stress of a serious illness.  The goal is to improve quality of life for both the patient and the family. Values and goals of care important to patient and family were attempted to be elicited.  Created space and opportunity  for husband to explore thoughts and feelings. [name] is tearful in discussions sharing memories of their relationship. He states prior to admission patient health had been on a continued decline. His appetite had significantly decreased to only sips and bites. He required assistance with ADLs. Utilized a walker or stand-by assistance due to unsteady gait.   We discussed His current illness and what it means in the larger context of His on-going co-morbidities. Natural disease trajectory and expectations were discussed. Questions and concerns addressed.   Questions and concerns were addressed.  Hard Choices booklet left for review. The family was encouraged to call with questions or concerns.  PMT will continue to support holistically as needed.  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 He and [name] have been partners for over 47 years. [name] is a Marine scientist. They recently became legally married this past year. They have no children. He has a sister who he is close with in addition to a niece.   Patient Values: [name] enjoys spending time with family, reading, and enjoying nature. Quality of life means the most to him.   Patient/Family Understanding of Illness: [name] verbalized his understanding of [name]'s current illness. He does not wish for him to suffer and knows being home is most important to [name]. Patient would not want aggressive treatments or work-up. [name] is tearful sharing his understanding and observation of continued decline.   Review of Systems  Unable to perform ROS: Mental status change    Physical Exam General: NAD, -ill appearing Cardiovascular: regular rate and rhythm Pulmonary: diminished bilaterally  Abdomen: soft, nontender, + bowel sounds Extremities: no edema, no joint deformities Skin: no rashes, warm and dry Neurological: alert to self only, pleasantly confused   Objective:   Primary Diagnoses: Present on Admission:  Acute  encephalopathy   Scheduled Meds:  allopurinol  100 mg Oral Daily   vitamin C  500 mg Oral Daily   aspirin EC  81 mg Oral Daily   colchicine  0.6 mg Oral Daily   enoxaparin (LOVENOX) injection  40 mg Subcutaneous Q24H   famotidine  20 mg Oral BID   feeding supplement  237 mL Oral TID BM   irbesartan  150 mg Oral Daily   And   hydrochlorothiazide  12.5 mg Oral Daily   insulin aspart  0-5 Units Subcutaneous QHS   insulin aspart  0-9 Units Subcutaneous TID WC   linagliptin  5 mg Oral Daily   multivitamin with minerals  1 tablet Oral Daily   pneumococcal 23 valent vaccine  0.5 mL Intramuscular Tomorrow-1000   simvastatin  40 mg Oral q1800   zinc sulfate  220 mg Oral Daily    Continuous Infusions:  remdesivir 100 mg in NS 100 mL 100 mg (06/20/21 0830)    PRN Meds: acetaminophen, chlorpheniramine-HYDROcodone, guaiFENesin-dextromethorphan, LORazepam, magnesium hydroxide, ondansetron **OR** ondansetron (ZOFRAN) IV, traZODone  No Known Allergies   Vital Signs:  BP 115/67 (BP Location: Left Arm)   Pulse 65   Temp 98.1 F (36.7 C)   Resp 16   Ht 5\' 8"  (1.727 m)   Wt 95 kg   SpO2 92%   BMI 31.84 kg/m  Pain Scale: 0-10   Pain Score: Asleep  SpO2: SpO2: 92 % O2 Device:SpO2: 92 % O2 Flow Rate: .   IO: Intake/output summary: No intake or output data in the 24 hours ending 06/20/21 1612  LBM: Last BM Date: 06/19/21 Baseline Weight: Weight: 97.5 kg Most recent weight: Weight: 95 kg      Palliative Assessment/Data: PPS 20-30%   Advanced Care Planning:   Primary Decision Maker: HCPOA-Charles Foster (husband)  Code Status/Advance Care Planning: DNR  A discussion was had today regarding advanced directives. Concepts specific to code status, artifical feeding and hydration, continued IV antibiotics and rehospitalization was had.  The difference between a aggressive medical intervention path and a palliative comfort care path was discussed    [name] is clear in his  expressed wishes to continue to treat the treatable while hospitalized. He would like some time to arrange things in the home and communicate with their support team with a focus on Mr. Arzola returning home next week with hospice support. He would like to honor patient's previously expressed wishes to pass away in his home amongst family and friends.   Hospice and Palliative Care services outpatient were explained and offered. Patient and family verbalized their understanding and awareness of both palliative and hospice's goals and philosophy of care.   Decisions/Changes to ACP: Discharge home with outpatient hospice support   Assessment & Plan:   SUMMARY OF RECOMMENDATIONS   DNR/DNI-as confirmed by husband Continue with current plan of care, no escalation of care [name] is clear in expressed wishes for patient to return home with outpatient hospice support to spend what time he has left amongst family and friends and he desired. He is re-arranging their home and lining up additional support for patient's  return. Hopeful he can discharge Monday.  Outpatient hospice Olivia Mackie, RN with [facility] aware). He will need transported home.   PMT will continue to support and follow as needed. Please call team line with urgent needs. NO WEEKEND COVERAGE.   Symptom Management:  Per Attending  Palliative Prophylaxis:  Aspiration, Bowel Regimen, Delirium Protocol, Frequent Pain Assessment, and Oral Care  Additional Recommendations (Limitations, Scope, Preferences): Treat the treatable, no escalation of care, DNR/DNI, goal is to discharge home with hospice   Psycho-social/Spiritual:  Desire for further Chaplaincy support: no Additional Recommendations: Education on Hospice  Prognosis:  < 6 weeks  Discharge Planning:  Home with Hospice   Discussed with: Dr. Florina Ou, RN, and Olivia Mackie, RN [facility]) via chat.   Husband, [name] expressed understanding and was in agreement with this plan.    Time In: 1400 Time Out: 1455 Time Total: 55 min.   Visit consisted of counseling and education dealing with the complex and emotionally intense issues of symptom management and palliative care in the setting of serious and potentially life-threatening illness.Greater than 50%  of this time was spent counseling and coordinating care related to the above assessment and plan.  Signed by:  Alda Lea, AGPCNP-BC Palliative Medicine Team  Phone: [phone] Pager: [phone] Amion: Bjorn Pippin   Thank you for allowing the Palliative Medicine Team to assist in the care of this patient. Please utilize secure chat with additional questions, if there is no response within 30 minutes please call the above phone number. Palliative Medicine Team providers are available by phone from 7am to 5pm daily and can be reached through the team cell phone.  Should this patient require assistance outside of these hours, please call the patient's attending physician.

## 2021-06-20 NOTE — Progress Notes (Signed)
     Referral received for [name] :goals of care discussion. Chart reviewed and updates received from RN. Patient assessed and is unable to engage appropriately in discussions. Remains confused with sitter at the bedside. Attempted to contact patient's significant other, [name]. Unable to reach. Voicemail left with contact information given.   PMT will re-attempt to contact family at a later time/date. Detailed note and recommendations to follow once GOC has been completed.   Thank you for your referral and allowing PMT to assist in [name]'s care.   Alda Lea, AGPCNP-BC Palliative Medicine Team  Phone: [phone] Pager: [phone] Amion: N. Cousar   NO CHARGE

## 2021-06-20 NOTE — TOC Initial Note (Signed)
Transition of Care [facility]) - Initial/Assessment Note    Patient Details  Name: [name] MRN: [id number] Date of Birth: [birth year]  Transition of Care [facility]) CM/SW Contact:    Shelbie Hutching, RN Phone Number: 06/20/2021, 4:38 PM  Clinical Narrative:                 Palliative care team has given [facility] a referral for home with hospice.  Plan will be for discharge home with hospice early next week.    Expected Discharge Plan: Home w Hospice Care Barriers to Discharge: Continued Medical Work up   Patient Goals and CMS Choice   CMS Medicare.gov Compare Post Acute Care list provided to:: Patient Represenative (must comment) Choice offered to / list presented to : Spouse  Expected Discharge Plan and Services Expected Discharge Plan: Home w Hospice Care In-house Referral: Hospice / Palliative Care Discharge Planning Services: CM Consult Post Acute Care Choice: Hospice                   DME Arranged: N/A                    Prior Living Arrangements/Services   Lives with:: Significant Other                   Activities of Daily Living Home Assistive Devices/Equipment: Cane (specify quad or straight) ADL Screening (condition at time of admission) Patient's cognitive ability adequate to safely complete daily activities?: Yes Is the patient deaf or have difficulty hearing?: No Does the patient have difficulty seeing, even when wearing glasses/contacts?: No Does the patient have difficulty concentrating, remembering, or making decisions?: Yes Patient able to express need for assistance with ADLs?: Yes Does the patient have difficulty dressing or bathing?: Yes Independently performs ADLs?: No Communication: Needs assistance Is this a change from baseline?: Pre-admission baseline Dressing (OT): Needs assistance Is this a change from baseline?: Pre-admission baseline Grooming: Needs assistance Is this a change from baseline?: Pre-admission  baseline Feeding: Independent Bathing: Needs assistance Is this a change from baseline?: Pre-admission baseline Toileting: Needs assistance Is this a change from baseline?: Pre-admission baseline In/Out Bed: Needs assistance Is this a change from baseline?: Pre-admission baseline Walks in Home: Needs assistance Is this a change from baseline?: Pre-admission baseline Does the patient have difficulty walking or climbing stairs?: Yes Weakness of Legs: Both Weakness of Arms/Hands: Both  Permission Sought/Granted                  Emotional Assessment              Admission diagnosis:  Acute encephalopathy [G93.40] Generalized weakness [R53.1] Altered mental status, unspecified altered mental status type [R41.82] COVID-19 virus infection [U07.1] Patient Active Problem List   Diagnosis Date Noted   Acute encephalopathy 06/18/2021   PCP:  Idelle Crouch, MD Pharmacy:   [id number] [place], [place] [address] [place] [address] Phone: [phone] Fax: [phone]     Social Determinants of Health (SDOH) Interventions    Readmission Risk Interventions No flowsheet data found.

## 2021-06-20 NOTE — Progress Notes (Signed)
Initial Nutrition Assessment  DOCUMENTATION CODES:  Not applicable  INTERVENTION:  Continue current diet as ordered, encourage PO intake Ensure Enlive po TID, each supplement provides 350 kcal and 20 grams of protein Magic cup TID with meals, each supplement provides 290 kcal and 9 grams of protein Vital Cuisine 1x/d, each supplement provides 520kcal and 22g of protein.  MVI with minerals daily  NUTRITION DIAGNOSIS:  Increased nutrient needs related to acute illness (COVID19) as evidenced by estimated needs.  GOAL:  Patient will meet greater than or equal to 90% of their needs  MONITOR:  PO intake, Supplement acceptance, Labs  REASON FOR ASSESSMENT:  Consult Assessment of nutrition requirement/status  ASSESSMENT:  87 y.o. male with history of HTN, and DM type 2 presented to ED from home for concerns of weakness over the past several days and AMS x 1 day.  Most of the history is obtained by patient's partner of 43 years. He states patient is normally alert, oriented but today was very confused and had difficulty performing his ADLs. Found to be COVID19 positive.  Pt currently in isolation for [place]. Confused, oriented to self only. Sitter at bedside to assist with reorienting pt. Noted that palliative care is consulting and hospice services are requested by partner at home.   Discussed intake with RN. Reports that pt is consuming ensures and magic cups, also some ice cream. Otherwise is not eating much. Per partner at better pt had better intake yesterday. Will schedule ensures to come TID and Magic cups to be on all trays.   Limited weight history to review. It appears pt has not been to MD in quite awhile (based on lack of care notes)  Nutritionally Relevant Medications: Scheduled Meds:  vitamin C  500 mg Oral Daily   colchicine  0.6 mg Oral Daily   famotidine  20 mg Oral BID   hydrochlorothiazide  12.5 mg Oral Daily   insulin aspart  0-5 Units Subcutaneous QHS   insulin  aspart  0-9 Units Subcutaneous TID WC   linagliptin  5 mg Oral Daily   simvastatin  40 mg Oral q1800   zinc sulfate  220 mg Oral Daily   PRN Meds: magnesium hydroxide, ondansetron  Labs Reviewed: SBG ranges from 235-118 mg/dL over the last 24 hours HgbA1c 12.8% (6/28)  NUTRITION - FOCUSED PHYSICAL EXAM: Defer due to isolation status  Diet Order:   Diet Order             Diet Carb Modified Fluid consistency: Thin; Room service appropriate? Yes  Diet effective now                   EDUCATION NEEDS:  No education needs have been identified at this time  Skin:  Skin Assessment: Reviewed RN Assessment (redness to sacrum)  Last BM:  6/30  Height:  Ht Readings from Last 1 Encounters:  06/18/21 5\' 8"  (1.727 m)    Weight:  Wt Readings from Last 1 Encounters:  06/18/21 95 kg    Ideal Body Weight:  70 kg  BMI:  Body mass index is 31.84 kg/m.  Estimated Nutritional Needs:  Kcal:  2100-2300 kcal/d Protein:  105-115g/d Fluid:  2.1-2.3 L/d   Ranell Patrick, RD, LDN Clinical Dietitian Pager on [place]

## 2021-06-20 NOTE — Progress Notes (Signed)
[facility])  [facility]) [facility] Liaison Note:  Received request from Doran Clay, RN Transitions of Care [facility]) Manager for hospice services at home after discharge. Chart and patient information under review by [facility] physician. Hospice eligibility pending at this time.  Spoke with Marvetta Gibbons, patient's partner, to initiate education related to hospice philosophy, services and team approach to care. Marvetta Gibbons verbalized understanding of information given. Per discussion, the plan is for discharge home by EMS on 7.5.22.   DME needs discussed. Patient has the following equipment in the home: hospital bed, BSC and cane. There are no DME needs at this time.   Address has been verified and is correct in the chart.   Please send signed and completed DNR home with patient/family. Please provide prescriptions at discharge as needed to ensure ongoing symptom management.   [facility] information and contact numbers given to [employer]. Above information shared with Doran Clay, RN [facility] manager.   Please call with any hospice related questions.   Thank you for the opportunity to participate in this patient's care.   Bobbie "Loren Racer, RN, BSN [facility] Liaison [phone]

## 2021-06-20 NOTE — Progress Notes (Signed)
PROGRESS NOTE    [name]  EPP:[id number] DOB: Jan 01, 1927 DOA: 06/17/2021 PCP: Idelle Crouch, MD    Brief Narrative:  87 year old male who presents for acute encephalopathy presumably due to COVID infection.  COVID appears to be relatively mild with no oxygen requirement, no fevers, no obvious pneumonia noted on x-ray.  Main driver for admission appears to be mental status changes.  At time of my evaluation the patient has mitts in place.  He is resting comfortably.  He is alert but oriented only to person.  Not to time or place.  Patient did not know which city where he went or what year it was.  Baseline mental status is unclear.  Every 24 hours mental status appears to be consistent with admission.  Patient remains compliant however has been noted by nursing staff to remove several IVs and thus has soft mitts in place.  Mental status and p.o. intake remains very poor.  Lengthy discussion with patient's partner 6/30.  Partner understands patient's recent decline.  Palliative care consultation requested.   Assessment & Plan:   Active Problems:   Acute encephalopathy  Acute COVID-19 infection Acute metabolic encephalopathy Generalized weakness Patient's vaccination status is unknown He is unable to tell me He appears to see Dr. Doy Hutching and [facility] however last clinic visit was several years ago Patient is alert and oriented to person only P.o. intake poor Plan: Continue remdesivir for now No indication for steroids Frequent reorienting measures Supportive care Dietitian consult  Diabetes mellitus type 2 with hyperglycemia Amaryl held Sliding scale with NovoLog Carb modified diet  Hyperlipidemia PTA statin  History of gout No evidence of exacerbation Continue allopurinol and colchicine  Essential hypertension Continue Diovan and Lopressor  History of coronary artery disease Continue PTA beta-blocker, aspirin, statin    DVT prophylaxis: Lovenox Code  Status: DNR Family Communication:  Anselmo Rod [phone] on 6/30 disposition Plan: Status is: Inpatient  Remains inpatient appropriate because:Inpatient level of care appropriate due to severity of illness  Dispo: The patient is from: Home              Anticipated d/c is to:  TBD              Patient currently not medically stable for discharge   Difficult to place patient No  COVID-19 infection.  Associated acute metabolic encephalopathy.  Baseline level of mentation is poor.  May be appropriate for transition to comfort measures and hospice.     Level of care: Med-Surg  Consultants:  None  Procedures:  None  Antimicrobials:  Remdesivir   Subjective: Patient seen and examined.  Sleeping this morning.  Easily aroused but very confused.  Objective: Vitals:   06/19/21 2037 06/19/21 2321 06/20/21 0520 06/20/21 0743  BP: 119/68 (!) 128/101 138/76 115/67  Pulse: (!) 51 61 (!) 58 65  Resp: 16 17 16 16   Temp: 97.6 F (36.4 C) 98.1 F (36.7 C) 98.3 F (36.8 C) 98.1 F (36.7 C)  TempSrc:  Oral Oral   SpO2: 97% 97% 99% 92%  Weight:      Height:       No intake or output data in the 24 hours ending 06/20/21 1251  Filed Weights   06/17/21 2315 06/18/21 1525  Weight: 97.5 kg 95 kg    Examination:  General exam: No apparent distress.  Confused Respiratory system: Lungs clear.  Normal work of breathing.  Room air Cardiovascular system: S1-S2, regular rate and rhythm, no murmurs  gastrointestinal system: Abdomen is nondistended, soft and nontender. No organomegaly or masses felt. Normal bowel sounds heard. Central nervous system: Alert.  Oriented to person only.  No focal deficits Extremities: Symmetric 5 x 5 power. Skin: No rashes, lesions or ulcers Psychiatry: Judgement and insight appear impaired. Mood & affect flattened.     Data Reviewed: I have personally reviewed following labs and imaging studies  CBC: Recent Labs  Lab 06/17/21 2334  06/19/21 0645 06/20/21 0547  WBC 5.6 5.7 5.5  NEUTROABS 3.0 2.8 2.7  HGB 16.0 15.8 15.7  HCT 47.4 45.6 45.3  MCV 91.2 88.5 87.8  PLT 117* 113* 250*   Basic Metabolic Panel: Recent Labs  Lab 06/17/21 2334 06/19/21 0645 06/20/21 0547  NA 137 140 141  K 3.9 4.1 3.9  CL 103 107 108  CO2 25 24 25   GLUCOSE 238* 157* 126*  BUN 22 27* 27*  CREATININE 1.34* 1.15 1.11  CALCIUM 8.8* 8.8* 8.9  MG 1.9  --   --    GFR: Estimated Creatinine Clearance: 47.4 mL/min (by C-G formula based on SCr of 1.11 mg/dL). Liver Function Tests: Recent Labs  Lab 06/17/21 2334 06/19/21 0645 06/20/21 0547  AST 93* 75* 78*  ALT 22 22 23   ALKPHOS 42 39 40  BILITOT 0.7 0.7 0.8  PROT 6.4* 5.7* 6.0*  ALBUMIN 3.3* 2.9* 3.2*   No results for input(s): LIPASE, AMYLASE in the last 168 hours. No results for input(s): AMMONIA in the last 168 hours. Coagulation Profile: Recent Labs  Lab 06/17/21 2334  INR 1.0   Cardiac Enzymes: No results for input(s): CKTOTAL, CKMB, CKMBINDEX, TROPONINI in the last 168 hours. BNP (last 3 results) No results for input(s): PROBNP in the last 8760 hours. HbA1C: Recent Labs    06/17/21 2334  HGBA1C 12.8*   CBG: Recent Labs  Lab 06/19/21 1334 06/19/21 2044 06/20/21 0513 06/20/21 0747 06/20/21 1140  GLUCAP 167* 235* 118* 124* 164*   Lipid Profile: Recent Labs    06/18/21 0744  TRIG 71   Thyroid Function Tests: No results for input(s): TSH, T4TOTAL, FREET4, T3FREE, THYROIDAB in the last 72 hours. Anemia Panel: Recent Labs    06/18/21 0744  FERRITIN 242   Sepsis Labs: Recent Labs  Lab 06/17/21 2334  PROCALCITON <0.10  LATICACIDVEN 1.7    Recent Results (from the past 240 hour(s))  Blood Culture (routine x 2)     Status: None (Preliminary result)   Collection Time: 06/17/21 11:34 PM   Specimen: BLOOD  Result Value Ref Range Status   Specimen Description BLOOD RIGHT ANTECUBITAL  Final   Special Requests   Final    BOTTLES DRAWN AEROBIC AND  ANAEROBIC Blood Culture adequate volume   Culture   Final    NO GROWTH 2 DAYS Performed at [facility], [address], [place], [place] [address]    Report Status PENDING  Incomplete  Resp Panel by RT-PCR (Flu A&B, Covid) Nasopharyngeal Swab     Status: Abnormal   Collection Time: 06/18/21  1:21 AM   Specimen: Nasopharyngeal Swab; Nasopharyngeal(NP) swabs in vial transport medium  Result Value Ref Range Status   SARS Coronavirus 2 by RT PCR POSITIVE (A) NEGATIVE Final    Comment: RESULT CALLED TO, READ BACK BY AND VERIFIED WITH: Ronn Melena RN [phone] 06/18/21 HNM (NOTE) SARS-CoV-2 target nucleic acids are DETECTED.  The SARS-CoV-2 RNA is generally detectable in upper respiratory specimens during the acute phase of infection. Positive results are indicative of the  presence of the identified virus, but do not rule out bacterial infection or co-infection with other pathogens not detected by the test. Clinical correlation with patient history and other diagnostic information is necessary to determine patient infection status. The expected result is Negative.  Fact Sheet for Patients: EntrepreneurPulse.com.au  Fact Sheet for Healthcare Providers: IncredibleEmployment.be  This test is not yet approved or cleared by the [place] FDA and  has been authorized for detection and/or diagnosis of SARS-CoV-2 by FDA under an Emergency Use Authorization (EUA).  This EUA will remain in effect (meaning this test can  be used) for the duration of  the COVID-19 declaration under Section 564(b)(1) of the Act, 21 U.S.C. section 360bbb-3(b)(1), unless the authorization is terminated or revoked sooner.     Influenza A by PCR NEGATIVE NEGATIVE Final   Influenza B by PCR NEGATIVE NEGATIVE Final    Comment: (NOTE) The Xpert Xpress SARS-CoV-2/FLU/RSV plus assay is intended as an aid in the diagnosis of influenza from Nasopharyngeal swab specimens  and should not be used as a sole basis for treatment. Nasal washings and aspirates are unacceptable for Xpert Xpress SARS-CoV-2/FLU/RSV testing.  Fact Sheet for Patients: EntrepreneurPulse.com.au  Fact Sheet for Healthcare Providers: IncredibleEmployment.be  This test is not yet approved or cleared by the [place] FDA and has been authorized for detection and/or diagnosis of SARS-CoV-2 by FDA under an Emergency Use Authorization (EUA). This EUA will remain in effect (meaning this test can be used) for the duration of the COVID-19 declaration under Section 564(b)(1) of the Act, 21 U.S.C. section 360bbb-3(b)(1), unless the authorization is terminated or revoked.  Performed at [facility], [address] [place], [place] [address]   Blood Culture (routine x 2)     Status: None (Preliminary result)   Collection Time: 06/18/21  1:21 AM   Specimen: BLOOD  Result Value Ref Range Status   Specimen Description BLOOD RIGHT WRIST  Final   Special Requests   Final    BOTTLES DRAWN AEROBIC AND ANAEROBIC Blood Culture results may not be optimal due to an inadequate volume of blood received in culture bottles   Culture   Final    NO GROWTH 2 DAYS Performed at [facility], [address], [place], [place] [address]    Report Status PENDING  Incomplete  Urine culture     Status: None   Collection Time: 06/18/21  1:21 AM   Specimen: In/Out Cath Urine  Result Value Ref Range Status   Specimen Description   Final    IN/OUT CATH URINE Performed at [facility], [address], [place], [place] [address]    Special Requests   Final    NONE Performed at [facility], [address] [place], [place] [address]    Culture   Final    NO GROWTH Performed at [facility], [place] [address], [place],  [address]    Report Status 06/19/2021 FINAL  Final         Radiology Studies: No results  found.      Scheduled Meds:  allopurinol  100 mg Oral Daily   vitamin C  500 mg Oral Daily   aspirin EC  81 mg Oral Daily   colchicine  0.6 mg Oral Daily   enoxaparin (LOVENOX) injection  40 mg Subcutaneous Q24H   famotidine  20 mg Oral BID   irbesartan  150 mg Oral Daily   And   hydrochlorothiazide  12.5 mg  Oral Daily   insulin aspart  0-5 Units Subcutaneous QHS   insulin aspart  0-9 Units Subcutaneous TID WC   linagliptin  5 mg Oral Daily   pneumococcal 23 valent vaccine  0.5 mL Intramuscular Tomorrow-1000   simvastatin  40 mg Oral q1800   zinc sulfate  220 mg Oral Daily   Continuous Infusions:  remdesivir 100 mg in NS 100 mL 100 mg (06/20/21 0830)     LOS: 2 days    Time spent: 25 minutes    Sidney Ace, MD [facility] Pager [phone]  If 7PM-7AM, please contact night-coverage 06/20/2021, 12:51 PM

## 2021-06-20 NOTE — Plan of Care (Deleted)
     Referral received for [name] :goals of care discussion. Chart reviewed and updates received from RN. Patient assessed and is unable to engage appropriately in discussions. Remains confused with sitter at the bedside. Attempted to contact patient's significant other, [name]. Unable to reach. Voicemail left with contact information given.   PMT will re-attempt to contact family at a later time/date. Detailed note and recommendations to follow once GOC has been completed.   Thank you for your referral and allowing PMT to assist in [name]'s care.   Alda Lea, AGPCNP-BC Palliative Medicine Team  Phone: [phone] Pager: [phone] Amion: N. [place]   NO CHARGE

## 2021-06-21 LAB — CBC WITH DIFFERENTIAL/PLATELET
Abs Immature Granulocytes: 0.02 10*3/uL (ref 0.00–0.07)
Basophils Absolute: 0 10*3/uL (ref 0.0–0.1)
Basophils Relative: 0 %
Eosinophils Absolute: 0.1 10*3/uL (ref 0.0–0.5)
Eosinophils Relative: 2 %
HCT: 45.3 % (ref 39.0–52.0)
Hemoglobin: 15.6 g/dL (ref 13.0–17.0)
Immature Granulocytes: 0 %
Lymphocytes Relative: 34 %
Lymphs Abs: 1.9 10*3/uL (ref 0.7–4.0)
MCH: 31 pg (ref 26.0–34.0)
MCHC: 34.4 g/dL (ref 30.0–36.0)
MCV: 89.9 fL (ref 80.0–100.0)
Monocytes Absolute: 0.6 10*3/uL (ref 0.1–1.0)
Monocytes Relative: 11 %
Neutro Abs: 2.9 10*3/uL (ref 1.7–7.7)
Neutrophils Relative %: 53 %
Platelets: 114 10*3/uL — ABNORMAL LOW (ref 150–400)
RBC: 5.04 MIL/uL (ref 4.22–5.81)
RDW: 12.9 % (ref 11.5–15.5)
Smear Review: NORMAL
WBC: 5.5 10*3/uL (ref 4.0–10.5)
nRBC: 0 % (ref 0.0–0.2)

## 2021-06-21 LAB — COMPREHENSIVE METABOLIC PANEL
ALT: 22 U/L (ref 0–44)
AST: 63 U/L — ABNORMAL HIGH (ref 15–41)
Albumin: 3 g/dL — ABNORMAL LOW (ref 3.5–5.0)
Alkaline Phosphatase: 44 U/L (ref 38–126)
Anion gap: 8 (ref 5–15)
BUN: 23 mg/dL (ref 8–23)
CO2: 25 mmol/L (ref 22–32)
Calcium: 8.9 mg/dL (ref 8.9–10.3)
Chloride: 107 mmol/L (ref 98–111)
Creatinine, Ser: 0.98 mg/dL (ref 0.61–1.24)
GFR, Estimated: >60 mL/min (ref >60)
Glucose, Bld: 153 mg/dL — ABNORMAL HIGH (ref 70–99)
Potassium: 3.5 mmol/L (ref 3.5–5.1)
Sodium: 140 mmol/L (ref 135–145)
Total Bilirubin: 1 mg/dL (ref 0.3–1.2)
Total Protein: 5.8 g/dL — ABNORMAL LOW (ref 6.5–8.1)

## 2021-06-21 LAB — GLUCOSE, CAPILLARY
Glucose-Capillary: 177 mg/dL — ABNORMAL HIGH (ref 70–99)
Glucose-Capillary: 156 mg/dL — ABNORMAL HIGH (ref 70–99)
Glucose-Capillary: 120 mg/dL — ABNORMAL HIGH (ref 70–99)
Glucose-Capillary: 153 mg/dL — ABNORMAL HIGH (ref 70–99)

## 2021-06-21 LAB — C-REACTIVE PROTEIN: CRP: 4.3 mg/dL — ABNORMAL HIGH (ref <1.0)

## 2021-06-21 MED ORDER — LORAZEPAM 0.5 MG PO TABS
0.5000 mg | ORAL_TABLET | ORAL | Status: DC | PRN
  Administered 2021-06-21: 0.5 mg via ORAL
  Filled 2021-06-21: qty 1

## 2021-06-21 NOTE — Progress Notes (Signed)
Pt IV site infiltrated. MD, aware. Ativan order modified for PO route. Pt does not want new IV placed at this time.

## 2021-06-21 NOTE — Progress Notes (Signed)
PROGRESS NOTE    [name]  WUJ:[id number] DOB: 25-Aug-1927 DOA: 06/17/2021 PCP: Idelle Crouch, MD    Brief Narrative:  87 year old male who presents for acute encephalopathy presumably due to COVID infection.  COVID appears to be relatively mild with no oxygen requirement, no fevers, no obvious pneumonia noted on x-ray.  Main driver for admission appears to be mental status changes.  At time of my evaluation the patient has mitts in place.  He is resting comfortably.  He is alert but oriented only to person.  Not to time or place.  Patient did not know which city where he went or what year it was.  Baseline mental status is unclear.  Every 24 hours mental status appears to be consistent with admission.  Patient remains compliant however has been noted by nursing staff to remove several IVs and thus has soft mitts in place.  Mental status and p.o. intake remains very poor.  Lengthy discussion with patient's partner 6/30.  Partner understands patient's recent decline.  Palliative care consultation requested.  After palliative care discussion decision was made to continue current scope of treatment without escalation and plan on discharge home with hospice services on Monday 7/4   Assessment & Plan:   Active Problems:   Acute encephalopathy  Acute COVID-19 infection Acute metabolic encephalopathy Generalized weakness Patient is unvaccinated per his partner Patient is alert and oriented to person only P.o. intake poor Functional decline Plan: Continue remdesivir for now No indication for steroids Frequent reorienting measures Supportive care Dietitian consult  Diabetes mellitus type 2 with hyperglycemia Amaryl held Sliding scale with NovoLog Carb modified diet  Hyperlipidemia PTA statin  History of gout No evidence of exacerbation Continue allopurinol and colchicine  Essential hypertension Continue Diovan and Lopressor  History of coronary artery  disease Continue PTA beta-blocker, aspirin, statin  Adult failure to thrive Per his partner patient has been declining recently.  Patient's partner is aware of his decline and after discussion with palliative care decision was made to continue current scope of treatment through the weekend and plan on discharge home with hospice services early next week.    DVT prophylaxis: Lovenox Code Status: DNR Family Communication:  Anselmo Rod [phone] on 6/30  disposition Plan: Status is: Inpatient  Remains inpatient appropriate because:Inpatient level of care appropriate due to severity of illness  Dispo: The patient is from: Home              Anticipated d/c is to: Home with hospice services              Patient currently not medically stable for discharge   Difficult to place patient No  COVID-19 infection.  Associated acute metabolic encephalopathy.  Adult failure to thrive.  Plan on discharge home with hospice services early next week.     Level of care: Med-Surg  Consultants:  None  Procedures:  None  Antimicrobials:  Remdesivir   Subjective: Patient seen and examined.  Sleeping this morning.  Easily arousable confused.  Per bedside sitter he is eating small amounts.  Objective: Vitals:   06/20/21 1721 06/20/21 2058 06/21/21 0440 06/21/21 0806  BP: (!) 154/73 110/61 136/79 121/80  Pulse: 64 (!) 51 (!) 58 (!) 54  Resp:  16 17 14   Temp: 97.7 F (36.5 C) 98.4 F (36.9 C) 98.4 F (36.9 C) 97.6 F (36.4 C)  TempSrc:   Oral Axillary  SpO2: 98% 99% 98% 99%  Weight:      Height:  Intake/Output Summary (Last 24 hours) at 06/21/2021 1102 Last data filed at 06/21/2021 0442 Gross per 24 hour  Intake 100.06 ml  Output 550 ml  Net -449.94 ml    Filed Weights   06/17/21 2315 06/18/21 1525  Weight: 97.5 kg 95 kg    Examination:  General exam: No apparent distress.  Confused Respiratory system: Lungs clear.  Normal work of breathing.  Room  air Cardiovascular system: S1-S2, regular rate and rhythm, no murmurs  gastrointestinal system: Abdomen is nondistended, soft and nontender. No organomegaly or masses felt. Normal bowel sounds heard. Central nervous system: Alert.  Oriented to person only.  No focal deficits Extremities: Symmetric 5 x 5 power. Skin: No rashes, lesions or ulcers Psychiatry: Judgement and insight appear impaired. Mood & affect flattened.     Data Reviewed: I have personally reviewed following labs and imaging studies  CBC: Recent Labs  Lab 06/17/21 2334 06/19/21 0645 06/20/21 0547 06/21/21 0525  WBC 5.6 5.7 5.5 5.5  NEUTROABS 3.0 2.8 2.7 2.9  HGB 16.0 15.8 15.7 15.6  HCT 47.4 45.6 45.3 45.3  MCV 91.2 88.5 87.8 89.9  PLT 117* 113* 115* 132*   Basic Metabolic Panel: Recent Labs  Lab 06/17/21 2334 06/19/21 0645 06/20/21 0547 06/21/21 0525  NA 137 140 141 140  K 3.9 4.1 3.9 3.5  CL 103 107 108 107  CO2 25 24 25 25   GLUCOSE 238* 157* 126* 153*  BUN 22 27* 27* 23  CREATININE 1.34* 1.15 1.11 0.98  CALCIUM 8.8* 8.8* 8.9 8.9  MG 1.9  --   --   --    GFR: Estimated Creatinine Clearance: 53.7 mL/min (by C-G formula based on SCr of 0.98 mg/dL). Liver Function Tests: Recent Labs  Lab 06/17/21 2334 06/19/21 0645 06/20/21 0547 06/21/21 0525  AST 93* 75* 78* 63*  ALT 22 22 23 22   ALKPHOS 42 39 40 44  BILITOT 0.7 0.7 0.8 1.0  PROT 6.4* 5.7* 6.0* 5.8*  ALBUMIN 3.3* 2.9* 3.2* 3.0*   No results for input(s): LIPASE, AMYLASE in the last 168 hours. No results for input(s): AMMONIA in the last 168 hours. Coagulation Profile: Recent Labs  Lab 06/17/21 2334  INR 1.0   Cardiac Enzymes: No results for input(s): CKTOTAL, CKMB, CKMBINDEX, TROPONINI in the last 168 hours. BNP (last 3 results) No results for input(s): PROBNP in the last 8760 hours. HbA1C: No results for input(s): HGBA1C in the last 72 hours.  CBG: Recent Labs  Lab 06/20/21 0747 06/20/21 1140 06/20/21 1720 06/20/21 2121  06/21/21 0947  GLUCAP 124* 164* 118* 244* 177*   Lipid Profile: No results for input(s): CHOL, HDL, LDLCALC, TRIG, CHOLHDL, LDLDIRECT in the last 72 hours.  Thyroid Function Tests: No results for input(s): TSH, T4TOTAL, FREET4, T3FREE, THYROIDAB in the last 72 hours. Anemia Panel: No results for input(s): VITAMINB12, FOLATE, FERRITIN, TIBC, IRON, RETICCTPCT in the last 72 hours.  Sepsis Labs: Recent Labs  Lab 06/17/21 2334  PROCALCITON <0.10  LATICACIDVEN 1.7    Recent Results (from the past 240 hour(s))  Blood Culture (routine x 2)     Status: None (Preliminary result)   Collection Time: 06/17/21 11:34 PM   Specimen: BLOOD  Result Value Ref Range Status   Specimen Description BLOOD RIGHT ANTECUBITAL  Final   Special Requests   Final    BOTTLES DRAWN AEROBIC AND ANAEROBIC Blood Culture adequate volume   Culture   Final    NO GROWTH 3 DAYS Performed at [facility],  [place], [place] [address]    Report Status PENDING  Incomplete  Resp Panel by RT-PCR (Flu A&B, Covid) Nasopharyngeal Swab     Status: Abnormal   Collection Time: 06/18/21  1:21 AM   Specimen: Nasopharyngeal Swab; Nasopharyngeal(NP) swabs in vial transport medium  Result Value Ref Range Status   SARS Coronavirus 2 by RT PCR POSITIVE (A) NEGATIVE Final    Comment: RESULT CALLED TO, READ BACK BY AND VERIFIED WITH: Ronn Melena RN [phone] 06/18/21 HNM (NOTE) SARS-CoV-2 target nucleic acids are DETECTED.  The SARS-CoV-2 RNA is generally detectable in upper respiratory specimens during the acute phase of infection. Positive results are indicative of the presence of the identified virus, but do not rule out bacterial infection or co-infection with other pathogens not detected by the test. Clinical correlation with patient history and other diagnostic information is necessary to determine patient infection status. The expected result is Negative.  Fact Sheet for  Patients: EntrepreneurPulse.com.au  Fact Sheet for Healthcare Providers: IncredibleEmployment.be  This test is not yet approved or cleared by the [place] FDA and  has been authorized for detection and/or diagnosis of SARS-CoV-2 by FDA under an Emergency Use Authorization (EUA).  This EUA will remain in effect (meaning this test can  be used) for the duration of  the COVID-19 declaration under Section 564(b)(1) of the Act, 21 U.S.C. section 360bbb-3(b)(1), unless the authorization is terminated or revoked sooner.     Influenza A by PCR NEGATIVE NEGATIVE Final   Influenza B by PCR NEGATIVE NEGATIVE Final    Comment: (NOTE) The Xpert Xpress SARS-CoV-2/FLU/RSV plus assay is intended as an aid in the diagnosis of influenza from Nasopharyngeal swab specimens and should not be used as a sole basis for treatment. Nasal washings and aspirates are unacceptable for Xpert Xpress SARS-CoV-2/FLU/RSV testing.  Fact Sheet for Patients: EntrepreneurPulse.com.au  Fact Sheet for Healthcare Providers: IncredibleEmployment.be  This test is not yet approved or cleared by the [place] FDA and has been authorized for detection and/or diagnosis of SARS-CoV-2 by FDA under an Emergency Use Authorization (EUA). This EUA will remain in effect (meaning this test can be used) for the duration of the COVID-19 declaration under Section 564(b)(1) of the Act, 21 U.S.C. section 360bbb-3(b)(1), unless the authorization is terminated or revoked.  Performed at [facility], [address] [place], [place] [address]   Blood Culture (routine x 2)     Status: None (Preliminary result)   Collection Time: 06/18/21  1:21 AM   Specimen: BLOOD  Result Value Ref Range Status   Specimen Description BLOOD RIGHT WRIST  Final   Special Requests   Final    BOTTLES DRAWN AEROBIC AND ANAEROBIC Blood Culture results may not be optimal  due to an inadequate volume of blood received in culture bottles   Culture   Final    NO GROWTH 3 DAYS Performed at [facility], [address] [place], [place] [address]    Report Status PENDING  Incomplete  Urine culture     Status: None   Collection Time: 06/18/21  1:21 AM   Specimen: In/Out Cath Urine  Result Value Ref Range Status   Specimen Description   Final    IN/OUT CATH URINE Performed at [facility], [address], [place], [place] [address]    Special Requests   Final    NONE Performed at [facility], [address] [place], [place] [address]    Culture  Final    NO GROWTH Performed at [facility], [place] [address] [place], [place] [address]    Report Status 06/19/2021 FINAL  Final         Radiology Studies: No results found.      Scheduled Meds:  allopurinol  100 mg Oral Daily   vitamin C  500 mg Oral Daily   aspirin EC  81 mg Oral Daily   colchicine  0.6 mg Oral Daily   enoxaparin (LOVENOX) injection  40 mg Subcutaneous Q24H   famotidine  20 mg Oral BID   feeding supplement  237 mL Oral TID BM   irbesartan  150 mg Oral Daily   And   hydrochlorothiazide  12.5 mg Oral Daily   insulin aspart  0-5 Units Subcutaneous QHS   insulin aspart  0-9 Units Subcutaneous TID WC   linagliptin  5 mg Oral Daily   multivitamin with minerals  1 tablet Oral Daily   pneumococcal 23 valent vaccine  0.5 mL Intramuscular Tomorrow-1000   simvastatin  40 mg Oral q1800   zinc sulfate  220 mg Oral Daily   Continuous Infusions:  remdesivir 100 mg in NS 100 mL 100 mg (06/21/21 1022)     LOS: 3 days    Time spent: 15 minutes    Sidney Ace, MD [facility] Pager [phone]  If 7PM-7AM, please contact night-coverage 06/21/2021, 11:02 AM

## 2021-06-22 LAB — COMPREHENSIVE METABOLIC PANEL
ALT: 21 U/L (ref 0–44)
AST: 51 U/L — ABNORMAL HIGH (ref 15–41)
Albumin: 3.1 g/dL — ABNORMAL LOW (ref 3.5–5.0)
Alkaline Phosphatase: 49 U/L (ref 38–126)
Anion gap: 11 (ref 5–15)
BUN: 25 mg/dL — ABNORMAL HIGH (ref 8–23)
CO2: 21 mmol/L — ABNORMAL LOW (ref 22–32)
Calcium: 8.7 mg/dL — ABNORMAL LOW (ref 8.9–10.3)
Chloride: 108 mmol/L (ref 98–111)
Creatinine, Ser: 0.94 mg/dL (ref 0.61–1.24)
GFR, Estimated: >60 mL/min (ref >60)
Glucose, Bld: 164 mg/dL — ABNORMAL HIGH (ref 70–99)
Potassium: 3.9 mmol/L (ref 3.5–5.1)
Sodium: 140 mmol/L (ref 135–145)
Total Bilirubin: 1.1 mg/dL (ref 0.3–1.2)
Total Protein: 5.8 g/dL — ABNORMAL LOW (ref 6.5–8.1)

## 2021-06-22 LAB — CBC WITH DIFFERENTIAL/PLATELET
Abs Immature Granulocytes: 0.01 10*3/uL (ref 0.00–0.07)
Basophils Absolute: 0 10*3/uL (ref 0.0–0.1)
Basophils Relative: 0 %
Eosinophils Absolute: 0.1 10*3/uL (ref 0.0–0.5)
Eosinophils Relative: 2 %
HCT: 45.7 % (ref 39.0–52.0)
Hemoglobin: 15.7 g/dL (ref 13.0–17.0)
Immature Granulocytes: 0 %
Lymphocytes Relative: 37 %
Lymphs Abs: 2.4 10*3/uL (ref 0.7–4.0)
MCH: 30.4 pg (ref 26.0–34.0)
MCHC: 34.4 g/dL (ref 30.0–36.0)
MCV: 88.6 fL (ref 80.0–100.0)
Monocytes Absolute: 0.7 10*3/uL (ref 0.1–1.0)
Monocytes Relative: 10 %
Neutro Abs: 3.3 10*3/uL (ref 1.7–7.7)
Neutrophils Relative %: 51 %
Platelets: 121 10*3/uL — ABNORMAL LOW (ref 150–400)
RBC: 5.16 MIL/uL (ref 4.22–5.81)
RDW: 12.6 % (ref 11.5–15.5)
WBC: 6.5 10*3/uL (ref 4.0–10.5)
nRBC: 0 % (ref 0.0–0.2)

## 2021-06-22 LAB — GLUCOSE, CAPILLARY
Glucose-Capillary: 146 mg/dL — ABNORMAL HIGH (ref 70–99)
Glucose-Capillary: 167 mg/dL — ABNORMAL HIGH (ref 70–99)
Glucose-Capillary: 180 mg/dL — ABNORMAL HIGH (ref 70–99)
Glucose-Capillary: 231 mg/dL — ABNORMAL HIGH (ref 70–99)

## 2021-06-22 LAB — C-REACTIVE PROTEIN: CRP: 4.2 mg/dL — ABNORMAL HIGH (ref <1.0)

## 2021-06-22 NOTE — Progress Notes (Signed)
PROGRESS NOTE    [name]  OJJ:[id number] DOB: 23-Jul-1927 DOA: 06/17/2021 PCP: Idelle Crouch, MD    Brief Narrative:   87 year old male who presents for acute encephalopathy presumably due to COVID infection.  COVID appears to be relatively mild with no oxygen requirement, no fevers, no obvious pneumonia noted on x-ray.  Main driver for admission appears to be mental status changes.  At time of my evaluation the patient has mitts in place.  He is resting comfortably.  He is alert but oriented only to person.  Not to time or place.  Patient did not know which city where he went or what year it was.  Baseline mental status is unclear.  Every 24 hours mental status appears to be consistent with admission.  Patient remains compliant however has been noted by nursing staff to remove several IVs and thus has soft mitts in place.  Mental status and p.o. intake remains very poor.  Lengthy discussion with patient's partner 6/30.  Partner understands patient's recent decline.  Palliative care consultation requested.  After palliative care discussion decision was made to continue current scope of treatment without escalation and plan on discharge home with hospice services on Monday 7/4  7/3: Patient's IV access infiltrated.  Does not wish for another to be placed.   Assessment & Plan:   Active Problems:   Acute encephalopathy  Acute COVID-19 infection Acute metabolic encephalopathy Generalized weakness Patient is unvaccinated per his partner Patient is alert and oriented to person only P.o. intake poor Functional decline Lost IV access on 7/2 Okay per MD not to replace Plan: DC remdesivir.  No need to replace IV access No indication for steroids Supportive care Plan on discharge home with hospice services 7/4  Diabetes mellitus type 2 with hyperglycemia Amaryl held Sliding scale with NovoLog Carb modified diet  Hyperlipidemia PTA statin  History of gout No evidence  of exacerbation Continue allopurinol and colchicine  Essential hypertension Continue Diovan and Lopressor  History of coronary artery disease Continue PTA beta-blocker, aspirin, statin  Adult failure to thrive Per his partner patient has been declining recently.  Patient's partner is aware of his decline and after discussion with palliative care decision was made to continue current scope of treatment through the weekend and plan on discharge home with hospice services early next week.  Plan on discharge home with hospice services 7/4    DVT prophylaxis: Lovenox Code Status: DNR Family Communication:  Anselmo Rod [phone] on 7/3 disposition Plan: Status is: Inpatient  Remains inpatient appropriate because:Inpatient level of care appropriate due to severity of illness  Dispo: The patient is from: Home              Anticipated d/c is to: Home with hospice services              Patient currently not medically stable for discharge   Difficult to place patient No  COVID-19 infection.  Also adult failure to thrive.  Plan discharge home with hospice services 7/4     Level of care: Med-Surg  Consultants:  None  Procedures:  None  Antimicrobials:  Remdesivir   Subjective: Patient seen and examined.  Sleeping this morning.  Easily awakened.  Confused.  Objective: Vitals:   06/21/21 1623 06/21/21 2246 06/22/21 0632 06/22/21 0757  BP: (!) 134/58 114/69 102/69 138/74  Pulse: (!) 52 (!) 54 (!) 59 61  Resp: 16 16 15 20   Temp: 97.9 F (36.6 C) 97.8 F (36.6 C)  97.8 F (36.6 C) 97.6 F (36.4 C)  TempSrc:      SpO2: 100% 98% 98% 99%  Weight:      Height:       No intake or output data in the 24 hours ending 06/22/21 1052   Filed Weights   06/17/21 2315 06/18/21 1525  Weight: 97.5 kg 95 kg    Examination:  General exam: No apparent distress.  Confused Respiratory system: Lungs clear.  Normal work of breathing.  Room air Cardiovascular system:  S1-S2, regular rate and rhythm, no murmurs  gastrointestinal system: Abdomen is nondistended, soft and nontender. No organomegaly or masses felt. Normal bowel sounds heard. Central nervous system: Alert.  Oriented to person only.  No focal deficits Extremities: Symmetric 5 x 5 power. Skin: No rashes, lesions or ulcers Psychiatry: Judgement and insight appear impaired. Mood & affect flattened.     Data Reviewed: I have personally reviewed following labs and imaging studies  CBC: Recent Labs  Lab 06/17/21 2334 06/19/21 0645 06/20/21 0547 06/21/21 0525 06/22/21 0501  WBC 5.6 5.7 5.5 5.5 6.5  NEUTROABS 3.0 2.8 2.7 2.9 3.3  HGB 16.0 15.8 15.7 15.6 15.7  HCT 47.4 45.6 45.3 45.3 45.7  MCV 91.2 88.5 87.8 89.9 88.6  PLT 117* 113* 115* 114* 009*   Basic Metabolic Panel: Recent Labs  Lab 06/17/21 2334 06/19/21 0645 06/20/21 0547 06/21/21 0525 06/22/21 0501  NA 137 140 141 140 140  K 3.9 4.1 3.9 3.5 3.9  CL 103 107 108 107 108  CO2 25 24 25 25  21*  GLUCOSE 238* 157* 126* 153* 164*  BUN 22 27* 27* 23 25*  CREATININE 1.34* 1.15 1.11 0.98 0.94  CALCIUM 8.8* 8.8* 8.9 8.9 8.7*  MG 1.9  --   --   --   --    GFR: Estimated Creatinine Clearance: 56 mL/min (by C-G formula based on SCr of 0.94 mg/dL). Liver Function Tests: Recent Labs  Lab 06/17/21 2334 06/19/21 0645 06/20/21 0547 06/21/21 0525 06/22/21 0501  AST 93* 75* 78* 63* 51*  ALT 22 22 23 22 21   ALKPHOS 42 39 40 44 49  BILITOT 0.7 0.7 0.8 1.0 1.1  PROT 6.4* 5.7* 6.0* 5.8* 5.8*  ALBUMIN 3.3* 2.9* 3.2* 3.0* 3.1*   No results for input(s): LIPASE, AMYLASE in the last 168 hours. No results for input(s): AMMONIA in the last 168 hours. Coagulation Profile: Recent Labs  Lab 06/17/21 2334  INR 1.0   Cardiac Enzymes: No results for input(s): CKTOTAL, CKMB, CKMBINDEX, TROPONINI in the last 168 hours. BNP (last 3 results) No results for input(s): PROBNP in the last 8760 hours. HbA1C: No results for input(s): HGBA1C in  the last 72 hours.  CBG: Recent Labs  Lab 06/21/21 0947 06/21/21 1206 06/21/21 1620 06/21/21 2108 06/22/21 0759  GLUCAP 177* 156* 120* 153* 146*   Lipid Profile: No results for input(s): CHOL, HDL, LDLCALC, TRIG, CHOLHDL, LDLDIRECT in the last 72 hours.  Thyroid Function Tests: No results for input(s): TSH, T4TOTAL, FREET4, T3FREE, THYROIDAB in the last 72 hours. Anemia Panel: No results for input(s): VITAMINB12, FOLATE, FERRITIN, TIBC, IRON, RETICCTPCT in the last 72 hours.  Sepsis Labs: Recent Labs  Lab 06/17/21 2334  PROCALCITON <0.10  LATICACIDVEN 1.7    Recent Results (from the past 240 hour(s))  Blood Culture (routine x 2)     Status: None (Preliminary result)   Collection Time: 06/17/21 11:34 PM   Specimen: BLOOD  Result Value Ref Range Status  Specimen Description BLOOD RIGHT ANTECUBITAL  Final   Special Requests   Final    BOTTLES DRAWN AEROBIC AND ANAEROBIC Blood Culture adequate volume   Culture   Final    NO GROWTH 3 DAYS Performed at [facility], [address], [place], [place] [address]    Report Status PENDING  Incomplete  Resp Panel by RT-PCR (Flu A&B, Covid) Nasopharyngeal Swab     Status: Abnormal   Collection Time: 06/18/21  1:21 AM   Specimen: Nasopharyngeal Swab; Nasopharyngeal(NP) swabs in vial transport medium  Result Value Ref Range Status   SARS Coronavirus 2 by RT PCR POSITIVE (A) NEGATIVE Final    Comment: RESULT CALLED TO, READ BACK BY AND VERIFIED WITH: Ronn Melena RN [phone] 06/18/21 HNM (NOTE) SARS-CoV-2 target nucleic acids are DETECTED.  The SARS-CoV-2 RNA is generally detectable in upper respiratory specimens during the acute phase of infection. Positive results are indicative of the presence of the identified virus, but do not rule out bacterial infection or co-infection with other pathogens not detected by the test. Clinical correlation with patient history and other diagnostic information is necessary to  determine patient infection status. The expected result is Negative.  Fact Sheet for Patients: EntrepreneurPulse.com.au  Fact Sheet for Healthcare Providers: IncredibleEmployment.be  This test is not yet approved or cleared by the [place] FDA and  has been authorized for detection and/or diagnosis of SARS-CoV-2 by FDA under an Emergency Use Authorization (EUA).  This EUA will remain in effect (meaning this test can  be used) for the duration of  the COVID-19 declaration under Section 564(b)(1) of the Act, 21 U.S.C. section 360bbb-3(b)(1), unless the authorization is terminated or revoked sooner.     Influenza A by PCR NEGATIVE NEGATIVE Final   Influenza B by PCR NEGATIVE NEGATIVE Final    Comment: (NOTE) The Xpert Xpress SARS-CoV-2/FLU/RSV plus assay is intended as an aid in the diagnosis of influenza from Nasopharyngeal swab specimens and should not be used as a sole basis for treatment. Nasal washings and aspirates are unacceptable for Xpert Xpress SARS-CoV-2/FLU/RSV testing.  Fact Sheet for Patients: EntrepreneurPulse.com.au  Fact Sheet for Healthcare Providers: IncredibleEmployment.be  This test is not yet approved or cleared by the [place] FDA and has been authorized for detection and/or diagnosis of SARS-CoV-2 by FDA under an Emergency Use Authorization (EUA). This EUA will remain in effect (meaning this test can be used) for the duration of the COVID-19 declaration under Section 564(b)(1) of the Act, 21 U.S.C. section 360bbb-3(b)(1), unless the authorization is terminated or revoked.  Performed at [facility], [address] [place], [place] [address]   Blood Culture (routine x 2)     Status: None (Preliminary result)   Collection Time: 06/18/21  1:21 AM   Specimen: BLOOD  Result Value Ref Range Status   Specimen Description BLOOD RIGHT WRIST  Final   Special Requests    Final    BOTTLES DRAWN AEROBIC AND ANAEROBIC Blood Culture results may not be optimal due to an inadequate volume of blood received in culture bottles   Culture   Final    NO GROWTH 3 DAYS Performed at [facility], [address], [place], [place] [address]    Report Status PENDING  Incomplete  Urine culture     Status: None   Collection Time: 06/18/21  1:21 AM   Specimen: In/Out Cath Urine  Result Value Ref Range Status   Specimen Description   Final  IN/OUT CATH URINE Performed at [facility], [address] [place], [place] [address]    Special Requests   Final    NONE Performed at [facility], [address] [place], [place] [address]    Culture   Final    NO GROWTH Performed at [facility], [place] [address] [place], [place], [place] [address]    Report Status 06/19/2021 FINAL  Final         Radiology Studies: No results found.      Scheduled Meds:  allopurinol  100 mg Oral Daily   vitamin C  500 mg Oral Daily   aspirin EC  81 mg Oral Daily   colchicine  0.6 mg Oral Daily   enoxaparin (LOVENOX) injection  40 mg Subcutaneous Q24H   famotidine  20 mg Oral BID   feeding supplement  237 mL Oral TID BM   irbesartan  150 mg Oral Daily   And   hydrochlorothiazide  12.5 mg Oral Daily   insulin aspart  0-5 Units Subcutaneous QHS   insulin aspart  0-9 Units Subcutaneous TID WC   linagliptin  5 mg Oral Daily   multivitamin with minerals  1 tablet Oral Daily   pneumococcal 23 valent vaccine  0.5 mL Intramuscular Tomorrow-1000   simvastatin  40 mg Oral q1800   zinc sulfate  220 mg Oral Daily   Continuous Infusions:  remdesivir 100 mg in NS 100 mL 100 mg (06/21/21 1022)     LOS: 4 days    Time spent: 15 minutes    Sidney Ace, MD [facility] Pager [phone]  If 7PM-7AM, please contact night-coverage 06/22/2021, 10:52 AM

## 2021-06-23 LAB — CULTURE, BLOOD (ROUTINE X 2)
Culture: NO GROWTH
Culture: NO GROWTH
Special Requests: ADEQUATE

## 2021-06-23 LAB — GLUCOSE, CAPILLARY: Glucose-Capillary: 274 mg/dL — ABNORMAL HIGH (ref 70–99)

## 2021-06-23 MED ORDER — LORAZEPAM 0.5 MG PO TABS: 0.5000 mg | ORAL_TABLET | Freq: Four times a day (QID) | ORAL | 0 refills | Status: AC | PRN

## 2021-06-23 MED ORDER — ONDANSETRON HCL 4 MG PO TABS: 4.0000 mg | ORAL_TABLET | Freq: Four times a day (QID) | ORAL | 0 refills | Status: AC | PRN

## 2021-06-23 NOTE — Care Management Important Message (Signed)
Important Message  Patient Details  Name: ANIEL HUBBLE MRN: [id number] Date of Birth: 02/03/1927   Medicare Important Message Given:  Other (see comment)  Discharging home with hospice services.  Medicare IM withheld at this time.   Dannette Barbara 06/23/2021, 8:20 AM

## 2021-06-23 NOTE — TOC Progression Note (Signed)
Transition of Care [facility]) - Progression Note    Patient Details  Name: [name] MRN: [id number] Date of Birth: 29-Aug-1927  [facility] of [facility] CM/SW Contact  Shelbie Hutching, RN Phone Number: 06/23/2021, 11:29 AM  Clinical Narrative:    EMS has arrived to transport patient home.     Expected Discharge Plan: Home w Hospice Care Barriers to Discharge: Barriers Resolved  Expected Discharge Plan and Services Expected Discharge Plan: Home w Hospice Care In-house Referral: Hospice / Palliative Care Discharge Planning Services: CM Consult Post Acute Care Choice: Hospice   Expected Discharge Date: 06/23/21               DME Arranged: N/A         HH Arranged: NA           Social Determinants of Health (SDOH) Interventions    Readmission Risk Interventions No flowsheet data found.

## 2021-06-23 NOTE — Discharge Summary (Signed)
Physician Discharge Summary  [name] BDZ:[id number] DOB: 1926-12-23 DOA: 06/17/2021  PCP: Idelle Crouch, MD  Admit date: 06/17/2021 Discharge date: 06/23/2021  Admitted From: Home Disposition: Home with hospice  Recommendations for Outpatient Follow-up:  Follow-up with outpatient hospice providers  Home Health: No Equipment/Devices: None  Discharge Condition: Hospice CODE STATUS: DNR Diet recommendation: Comfort feeds  Brief/Interim Summary: 87 year old male who presents for acute encephalopathy presumably due to COVID infection.  COVID appears to be relatively mild with no oxygen requirement, no fevers, no obvious pneumonia noted on x-ray.  Main driver for admission appears to be mental status changes.  At time of my evaluation the patient has mitts in place.  He is resting comfortably.  He is alert but oriented only to person.  Not to time or place.  Patient did not know which city where he went or what year it was.  Baseline mental status is unclear.   Every 24 hours mental status appears to be consistent with admission.  Patient remains compliant however has been noted by nursing staff to remove several IVs and thus has soft mitts in place.   Mental status and p.o. intake remains very poor.  Lengthy discussion with patient's partner 6/30.  Partner understands patient's recent decline.  Palliative care consultation requested.   After palliative care discussion decision was made to continue current scope of treatment without escalation and plan on discharge home with hospice services on Monday 7/4   7/3: Patient's IV access infiltrated.  Does not wish for another to be placed. 7/4: Patient was accepted for home hospice services.  Discharged with EMS transport  Discharge Diagnoses:  Active Problems:   Acute encephalopathy  Acute COVID-19 infection Acute metabolic encephalopathy Generalized weakness Patient is unvaccinated per his partner Patient is alert and oriented  to person only P.o. intake poor Functional decline Lost IV access on 7/2 Okay per MD not to replace Discharge home with hospice services No indication for continued steroids on discharge   Diabetes mellitus type 2 with hyperglycemia Diabetes medications held on discharge as patient will discharge home with hospice services   Hyperlipidemia Medications held   History of gout Medications held   Essential hypertension Medications held   History of coronary artery disease Medications held   Adult failure to thrive Per his partner patient has been declining recently.  Patient's partner is aware of his decline and after discussion with palliative care decision was made to continue current scope of treatment through the weekend and plan on discharge home with hospice services early next week.  On 7/4 patient discharged home with hospice services Discharge Instructions  Discharge Instructions     Diet - low sodium heart healthy   Complete by: As directed    Increase activity slowly   Complete by: As directed       Allergies as of 06/23/2021   No Known Allergies      Medication List     STOP taking these medications    allopurinol 100 MG tablet Commonly known as: ZYLOPRIM   colchicine 0.6 MG tablet   glimepiride 4 MG tablet Commonly known as: AMARYL   metoprolol tartrate 25 MG tablet Commonly known as: LOPRESSOR   simvastatin 40 MG tablet Commonly known as: ZOCOR   valsartan-hydrochlorothiazide 160-12.5 MG tablet Commonly known as: DIOVAN-HCT       TAKE these medications    LORazepam 0.5 MG tablet Commonly known as: ATIVAN Take 1 tablet (0.5 mg total) by mouth every 6 (  six) hours as needed for up to 5 days for anxiety.   ondansetron 4 MG tablet Commonly known as: ZOFRAN Take 1 tablet (4 mg total) by mouth every 6 (six) hours as needed for up to 5 days for nausea.        No Known Allergies  Consultations: Palliative  care   Procedures/Studies: CT Head Wo Contrast  Result Date: 06/18/2021 CLINICAL DATA:  Altered mental status EXAM: CT HEAD WITHOUT CONTRAST TECHNIQUE: Contiguous axial images were obtained from the base of the skull through the vertex without intravenous contrast. COMPARISON:  None. FINDINGS: Brain: Normal anatomic configuration. Moderate parenchymal volume loss is commensurate with the patient's age. Mild periventricular white matter changes are present likely reflecting the sequela of small vessel ischemia. Remote lacunar infarct is noted within the right insular cortex. No abnormal intra or extra-axial mass lesion or fluid collection. No abnormal mass effect or midline shift. No evidence of acute intracranial hemorrhage or infarct. Ventricular size is normal. Cerebellum unremarkable. Vascular: No asymmetric hyperdense vasculature at the skull base. Skull: Intact Sinuses/Orbits: Paranasal sinuses are clear. Orbits are unremarkable. Other: Mastoid air cells and middle ear cavities are clear. IMPRESSION: No acute intracranial hemorrhage or infarct. Moderate senescent change.  Remote right insular lacunar infarct. Electronically Signed   By: Fidela Salisbury MD   On: 06/18/2021 00:44   DG Chest Port 1 View  Result Date: 06/18/2021 CLINICAL DATA:  Questionable sepsis EXAM: PORTABLE CHEST 1 VIEW COMPARISON:  None. FINDINGS: Heart is normal size. No confluent airspace opacities or effusions. Low lung volumes. No acute bony abnormality. IMPRESSION: No active disease. Electronically Signed   By: Rolm Baptise M.D.   On: 06/18/2021 00:29   (Echo, Carotid, EGD, Colonoscopy, ERCP)    Subjective: Patient seen and examined on the day of discharge.  Sleepy, in no visible distress  Discharge Exam: Vitals:   06/22/21 2351 06/23/21 0752  BP: (!) 96/55 118/69  Pulse: (!) 56 (!) 55  Resp: 16 16  Temp:  (!) 97.5 F (36.4 C)  SpO2: 96% 96%   Vitals:   06/22/21 1605 06/22/21 2009 06/22/21 2351 06/23/21 0752   BP: (!) 106/57 (!) 106/58 (!) 96/55 118/69  Pulse: (!) 58 (!) 55 (!) 56 (!) 55  Resp: 18 18 16 16   Temp: 97.8 F (36.6 C) (!) 97.4 F (36.3 C) (!) 97.4 F (36.3 C) (!) 97.5 F (36.4 C)  TempSrc:  Axillary    SpO2: 98% 96% 96% 96%  Weight:      Height:       Limited exam due to discharge plan Home with hospice  General: Sleepy.  In no visible distress Cardiovascular: Regular rate and rhythm, no murmurs Respiratory: Poor respiratory effort.  Lungs clear     The results of significant diagnostics from this hospitalization (including imaging, microbiology, ancillary and laboratory) are listed below for reference.     Microbiology: Recent Results (from the past 240 hour(s))  Blood Culture (routine x 2)     Status: None   Collection Time: 06/17/21 11:34 PM   Specimen: BLOOD  Result Value Ref Range Status   Specimen Description BLOOD RIGHT ANTECUBITAL  Final   Special Requests   Final    BOTTLES DRAWN AEROBIC AND ANAEROBIC Blood Culture adequate volume   Culture   Final    NO GROWTH 5 DAYS Performed at [facility], [address] [place], [place] [address]    Report Status 06/23/2021 FINAL  Final  Resp Panel by  RT-PCR (Flu A&B, Covid) Nasopharyngeal Swab     Status: Abnormal   Collection Time: 06/18/21  1:21 AM   Specimen: Nasopharyngeal Swab; Nasopharyngeal(NP) swabs in vial transport medium  Result Value Ref Range Status   SARS Coronavirus 2 by RT PCR POSITIVE (A) NEGATIVE Final    Comment: RESULT CALLED TO, READ BACK BY AND VERIFIED WITH: Ronn Melena RN [phone] 06/18/21 HNM (NOTE) SARS-CoV-2 target nucleic acids are DETECTED.  The SARS-CoV-2 RNA is generally detectable in upper respiratory specimens during the acute phase of infection. Positive results are indicative of the presence of the identified virus, but do not rule out bacterial infection or co-infection with other pathogens not detected by the test. Clinical correlation with patient history  and other diagnostic information is necessary to determine patient infection status. The expected result is Negative.  Fact Sheet for Patients: EntrepreneurPulse.com.au  Fact Sheet for Healthcare Providers: IncredibleEmployment.be  This test is not yet approved or cleared by the [place] FDA and  has been authorized for detection and/or diagnosis of SARS-CoV-2 by FDA under an Emergency Use Authorization (EUA).  This EUA will remain in effect (meaning this test can  be used) for the duration of  the COVID-19 declaration under Section 564(b)(1) of the Act, 21 U.S.C. section 360bbb-3(b)(1), unless the authorization is terminated or revoked sooner.     Influenza A by PCR NEGATIVE NEGATIVE Final   Influenza B by PCR NEGATIVE NEGATIVE Final    Comment: (NOTE) The Xpert Xpress SARS-CoV-2/FLU/RSV plus assay is intended as an aid in the diagnosis of influenza from Nasopharyngeal swab specimens and should not be used as a sole basis for treatment. Nasal washings and aspirates are unacceptable for Xpert Xpress SARS-CoV-2/FLU/RSV testing.  Fact Sheet for Patients: EntrepreneurPulse.com.au  Fact Sheet for Healthcare Providers: IncredibleEmployment.be  This test is not yet approved or cleared by the [place] FDA and has been authorized for detection and/or diagnosis of SARS-CoV-2 by FDA under an Emergency Use Authorization (EUA). This EUA will remain in effect (meaning this test can be used) for the duration of the COVID-19 declaration under Section 564(b)(1) of the Act, 21 U.S.C. section 360bbb-3(b)(1), unless the authorization is terminated or revoked.  Performed at [facility], [address] [place], [place] [address]   Blood Culture (routine x 2)     Status: None   Collection Time: 06/18/21  1:21 AM   Specimen: BLOOD  Result Value Ref Range Status   Specimen Description BLOOD RIGHT  WRIST  Final   Special Requests   Final    BOTTLES DRAWN AEROBIC AND ANAEROBIC Blood Culture results may not be optimal due to an inadequate volume of blood received in culture bottles   Culture   Final    NO GROWTH 5 DAYS Performed at [facility], [address], [place], [place] [address]    Report Status 06/23/2021 FINAL  Final  Urine culture     Status: None   Collection Time: 06/18/21  1:21 AM   Specimen: In/Out Cath Urine  Result Value Ref Range Status   Specimen Description   Final    IN/OUT CATH URINE Performed at [facility], [address], [place], [place] [address]    Special Requests   Final    NONE Performed at [facility], [address], [place], [place] [address]    Culture   Final    NO GROWTH Performed at [facility], [address], [place], [place] [address]  Report Status 06/19/2021 FINAL  Final     Labs: BNP (last 3 results) No results for input(s): BNP in the last 8760 hours. Basic Metabolic Panel: Recent Labs  Lab 06/17/21 2334 06/19/21 0645 06/20/21 0547 06/21/21 0525 06/22/21 0501  NA 137 140 141 140 140  K 3.9 4.1 3.9 3.5 3.9  CL 103 107 108 107 108  CO2 25 24 25 25  21*  GLUCOSE 238* 157* 126* 153* 164*  BUN 22 27* 27* 23 25*  CREATININE 1.34* 1.15 1.11 0.98 0.94  CALCIUM 8.8* 8.8* 8.9 8.9 8.7*  MG 1.9  --   --   --   --    Liver Function Tests: Recent Labs  Lab 06/17/21 2334 06/19/21 0645 06/20/21 0547 06/21/21 0525 06/22/21 0501  AST 93* 75* 78* 63* 51*  ALT 22 22 23 22 21   ALKPHOS 42 39 40 44 49  BILITOT 0.7 0.7 0.8 1.0 1.1  PROT 6.4* 5.7* 6.0* 5.8* 5.8*  ALBUMIN 3.3* 2.9* 3.2* 3.0* 3.1*   No results for input(s): LIPASE, AMYLASE in the last 168 hours. No results for input(s): AMMONIA in the last 168 hours. CBC: Recent Labs  Lab 06/17/21 2334 06/19/21 0645 06/20/21 0547 06/21/21 0525 06/22/21 0501  WBC 5.6 5.7 5.5 5.5 6.5  NEUTROABS 3.0 2.8 2.7 2.9 3.3  HGB 16.0 15.8  15.7 15.6 15.7  HCT 47.4 45.6 45.3 45.3 45.7  MCV 91.2 88.5 87.8 89.9 88.6  PLT 117* 113* 115* 114* 121*   Cardiac Enzymes: No results for input(s): CKTOTAL, CKMB, CKMBINDEX, TROPONINI in the last 168 hours. BNP: Invalid input(s): POCBNP CBG: Recent Labs  Lab 06/22/21 0759 06/22/21 1214 06/22/21 1606 06/22/21 2204 06/23/21 0753  GLUCAP 146* 167* 180* 231* 274*   D-Dimer No results for input(s): DDIMER in the last 72 hours. Hgb A1c No results for input(s): HGBA1C in the last 72 hours. Lipid Profile No results for input(s): CHOL, HDL, LDLCALC, TRIG, CHOLHDL, LDLDIRECT in the last 72 hours. Thyroid function studies No results for input(s): TSH, T4TOTAL, T3FREE, THYROIDAB in the last 72 hours.  Invalid input(s): FREET3 Anemia work up No results for input(s): VITAMINB12, FOLATE, FERRITIN, TIBC, IRON, RETICCTPCT in the last 72 hours. Urinalysis    Component Value Date/Time   COLORURINE YELLOW (A) 06/18/2021 0121   APPEARANCEUR CLOUDY (A) 06/18/2021 0121   LABSPEC 1.023 06/18/2021 0121   PHURINE 5.0 06/18/2021 0121   GLUCOSEU 150 (A) 06/18/2021 0121   HGBUR MODERATE (A) 06/18/2021 0121   BILIRUBINUR NEGATIVE 06/18/2021 0121   KETONESUR 5 (A) 06/18/2021 0121   PROTEINUR 30 (A) 06/18/2021 0121   NITRITE NEGATIVE 06/18/2021 0121   LEUKOCYTESUR NEGATIVE 06/18/2021 0121   Sepsis Labs Invalid input(s): PROCALCITONIN,  WBC,  LACTICIDVEN Microbiology Recent Results (from the past 240 hour(s))  Blood Culture (routine x 2)     Status: None   Collection Time: 06/17/21 11:34 PM   Specimen: BLOOD  Result Value Ref Range Status   Specimen Description BLOOD RIGHT ANTECUBITAL  Final   Special Requests   Final    BOTTLES DRAWN AEROBIC AND ANAEROBIC Blood Culture adequate volume   Culture   Final    NO GROWTH 5 DAYS Performed at [facility], [address] [place], [place] [address]    Report Status 06/23/2021 FINAL  Final  Resp Panel by RT-PCR (Flu A&B, Covid)  Nasopharyngeal Swab     Status: Abnormal   Collection Time: 06/18/21  1:21 AM   Specimen: Nasopharyngeal Swab; Nasopharyngeal(NP) swabs in  vial transport medium  Result Value Ref Range Status   SARS Coronavirus 2 by RT PCR POSITIVE (A) NEGATIVE Final    Comment: RESULT CALLED TO, READ BACK BY AND VERIFIED WITH: Ronn Melena RN [phone] 06/18/21 HNM (NOTE) SARS-CoV-2 target nucleic acids are DETECTED.  The SARS-CoV-2 RNA is generally detectable in upper respiratory specimens during the acute phase of infection. Positive results are indicative of the presence of the identified virus, but do not rule out bacterial infection or co-infection with other pathogens not detected by the test. Clinical correlation with patient history and other diagnostic information is necessary to determine patient infection status. The expected result is Negative.  Fact Sheet for Patients: EntrepreneurPulse.com.au  Fact Sheet for Healthcare Providers: IncredibleEmployment.be  This test is not yet approved or cleared by the [place] FDA and  has been authorized for detection and/or diagnosis of SARS-CoV-2 by FDA under an Emergency Use Authorization (EUA).  This EUA will remain in effect (meaning this test can  be used) for the duration of  the COVID-19 declaration under Section 564(b)(1) of the Act, 21 U.S.C. section 360bbb-3(b)(1), unless the authorization is terminated or revoked sooner.     Influenza A by PCR NEGATIVE NEGATIVE Final   Influenza B by PCR NEGATIVE NEGATIVE Final    Comment: (NOTE) The Xpert Xpress SARS-CoV-2/FLU/RSV plus assay is intended as an aid in the diagnosis of influenza from Nasopharyngeal swab specimens and should not be used as a sole basis for treatment. Nasal washings and aspirates are unacceptable for Xpert Xpress SARS-CoV-2/FLU/RSV testing.  Fact Sheet for Patients: EntrepreneurPulse.com.au  Fact Sheet for  Healthcare Providers: IncredibleEmployment.be  This test is not yet approved or cleared by the [place] FDA and has been authorized for detection and/or diagnosis of SARS-CoV-2 by FDA under an Emergency Use Authorization (EUA). This EUA will remain in effect (meaning this test can be used) for the duration of the COVID-19 declaration under Section 564(b)(1) of the Act, 21 U.S.C. section 360bbb-3(b)(1), unless the authorization is terminated or revoked.  Performed at [facility], [address] [place], [place] [address]   Blood Culture (routine x 2)     Status: None   Collection Time: 06/18/21  1:21 AM   Specimen: BLOOD  Result Value Ref Range Status   Specimen Description BLOOD RIGHT WRIST  Final   Special Requests   Final    BOTTLES DRAWN AEROBIC AND ANAEROBIC Blood Culture results may not be optimal due to an inadequate volume of blood received in culture bottles   Culture   Final    NO GROWTH 5 DAYS Performed at [facility], [address] [place], [place] [address]    Report Status 06/23/2021 FINAL  Final  Urine culture     Status: None   Collection Time: 06/18/21  1:21 AM   Specimen: In/Out Cath Urine  Result Value Ref Range Status   Specimen Description   Final    IN/OUT CATH URINE Performed at [facility], [address], [place], [place] [address]    Special Requests   Final    NONE Performed at [facility], [address], [place], [place] [address]    Culture   Final    NO GROWTH Performed at [facility], [place] [address], [place], [place] [address]    Report Status 06/19/2021 FINAL  Final     Time coordinating discharge: Over 30 minutes  SIGNED:   Sidney Ace, MD  [facility] 06/23/2021, 10:29 AM Pager   If 7PM-7AM, please contact night-coverage

## 2021-06-23 NOTE — TOC Transition Note (Signed)
Transition of Care [facility]) - CM/SW Discharge Note   Patient Details  Name: ADRION MENZ MRN: [id number] Date of Birth: 1927/05/27  Transition of Care [facility]) CM/SW Contact:  Shelbie Hutching, RN Phone Number: 06/23/2021, 10:07 AM   Clinical Narrative:    Patient has been accepted by [facility] for Hospice at home.  Patient will discharge home with [facility].  Patient has all needed DME at home.  Once discharge is completed RNCM will arrange [facility] to transport patient home.  Hospice can come out today between 1330 and 1400.     Final next level of care: Home w Hospice Care Barriers to Discharge: Barriers Resolved   Patient Goals and CMS Choice Patient states their goals for this hospitalization and ongoing recovery are:: [name] just want to get the patient back home CMS Medicare.gov Compare Post Acute Care list provided to:: Patient Represenative (must comment) Choice offered to / list presented to : Spouse  Discharge Placement                Patient to be transferred to facility by: Transferred home by [facility] Name of family member notified: [facility] In-house Referral: Hospice / Palliative Care Discharge Planning Services: CM Consult Post Acute Care Choice: Hospice          DME Arranged: N/A         HH Arranged: NA          Social Determinants of Health (SDOH) Interventions     Readmission Risk Interventions No flowsheet data found.

## 2021-06-30 ENCOUNTER — Ambulatory Visit: Payer: Medicare Other | Admitting: Dermatology

## 2021-12-25 IMAGING — DX DG CHEST 1V PORT
1 series · 1 of 1 positions shown · non-contrast
Comparison: None.

CLINICAL DATA: Questionable sepsis

EXAM:
PORTABLE CHEST 1 VIEW

[chest ap]
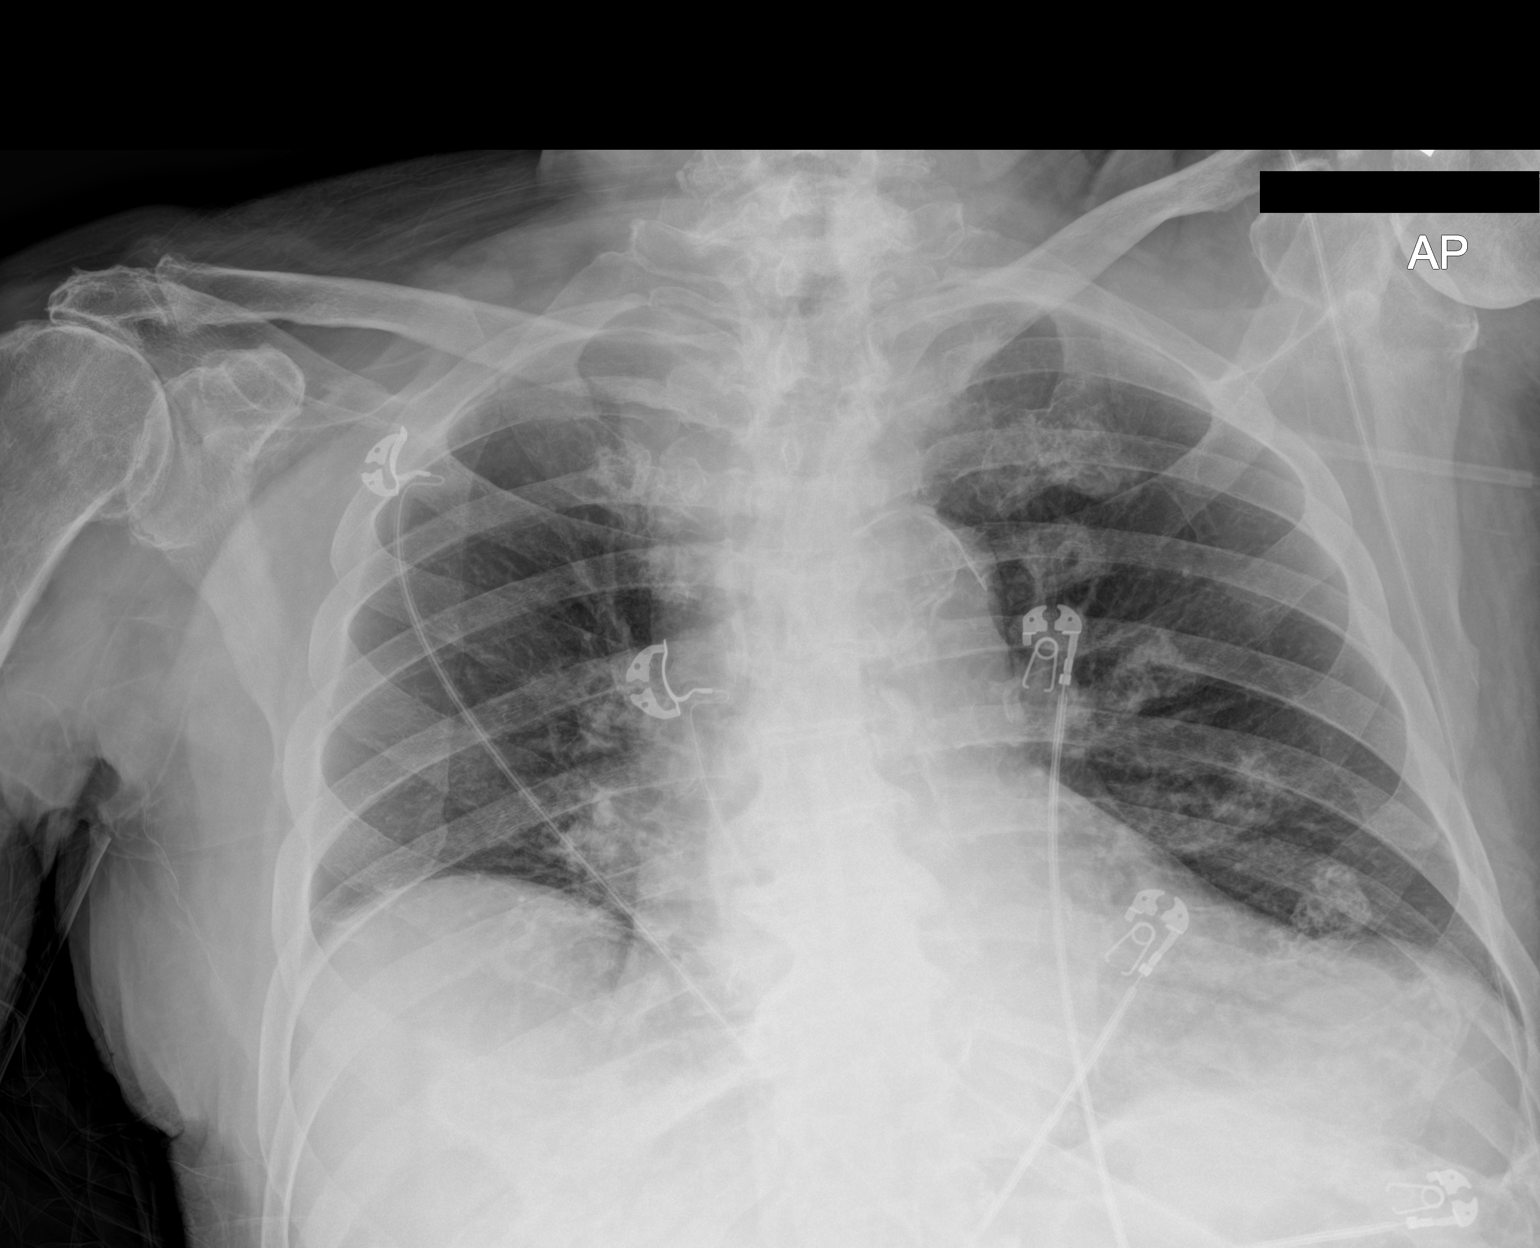

[1 of 1 positions shown; findings below may reference images not displayed]

FINDINGS: Heart is normal size. No confluent airspace opacities or effusions.
Low lung volumes. No acute bony abnormality.
IMPRESSION: No active disease.

## 2022-04-14 ENCOUNTER — Telehealth: Payer: Self-pay | Admitting: Nurse Practitioner

## 2022-04-14 NOTE — Telephone Encounter (Signed)
Spoke with patient's partner Elnoria Howard, regarding the Palliative referral/services and all questions were answered and he was in agreement with beginning services.  I have scheduled an In-home Consult for 04/23/22 @ 10:45 AM ?

## 2022-04-23 ENCOUNTER — Encounter: Payer: Self-pay | Admitting: Nurse Practitioner

## 2022-04-23 ENCOUNTER — Other Ambulatory Visit: Payer: Medicare Other | Admitting: Nurse Practitioner

## 2022-04-23 DIAGNOSIS — Z515 Encounter for palliative care: Secondary | ICD-10-CM

## 2022-04-23 DIAGNOSIS — R2681 Unsteadiness on feet: Secondary | ICD-10-CM

## 2022-04-23 DIAGNOSIS — R630 Anorexia: Secondary | ICD-10-CM

## 2022-04-23 DIAGNOSIS — F0280 Dementia in other diseases classified elsewhere without behavioral disturbance: Secondary | ICD-10-CM

## 2022-04-23 NOTE — Progress Notes (Addendum)
? ? ?Manufacturing engineer ?Community Palliative Care Consult Note ?Telephone: [phone]  ?Fax: [phone]  ? ?Date of encounter: 04/23/22 ?9:54 PM ?PATIENT NAME: Chad Chad ?[place] [address]   ?[phone] (home)  ?DOB: Feb 03, 1927 ?MRN: [id number] ?PRIMARY CARE PROVIDER:    ?Idelle Crouch, MD,  ?[place] ?[place] [address] ?[phone] ? ?REFERRING PROVIDER:   ?Idelle Crouch, MD ?[facility] ?[place],  [place] [address] ?[phone] ? ?RESPONSIBLE PARTY:    ?Contact Information   ? ? Name Relation Home Work Mobile  ? Irish Lack [phone]    ? ?  ? ?I met face to face with patient and family in home. Palliative Care was asked to follow this patient by consultation request of  Sparks, Leonie Douglas, MD to address advance care planning and complex medical decision making. This is the initial visit.                           ?ASSESSMENT AND PLAN / RECOMMENDATIONS:  ?Symptom Management/Plan: ?1. Advance Care Planning;  DNR ? ?2. Anorexia, progressive, chronic; discussed nutrition, supplements, continue to weight with current weight 172 lbs today ? ?3. Unsteady gait using walker, discussed safety precautions, fall risk, adapted the environment for safety.  ? ?4. Goals of Care: Goals include to maximize quality of life and symptom management. Our advance care planning conversation included a discussion about:    ?The value and importance of advance care planning  ?Exploration of personal, cultural or spiritual beliefs that might influence medical decisions  ?Exploration of goals of care in the event of a sudden injury or illness  ?Identification and preparation of a healthcare agent  ?Review and updating or creation of an advance directive document. ? ?5. Palliative care encounter; Palliative care encounter; Palliative medicine team will continue to support patient, patient's family, and medical  team. Visit consisted of counseling and education dealing with the complex and emotionally intense issues of symptom management and palliative care in the setting of serious and potentially life-threatening illness ? ?Follow up Palliative Care Visit: Palliative care will continue to follow for complex medical decision making, advance care planning, and clarification of goals. Return 4 weeks or prn. ? ?I spent 62 minutes providing this consultation. More than 50% of the time in this consultation was spent in counseling and care coordination. ?PPS: 40% ? ?Chief Complaint: Initial palliative consult for complex medical decision making ? ?HISTORY OF PRESENT ILLNESS:  Chad Chad is a 88 y.o. year old male  with multiple medical problems including dementia. Chad Chad was recently d/c from hospice services due to stability. I called Chad Chad's husband, [name] to confirm in person pc visit, in agreement. I talked with [name] prior to seeing Chad Chad about when the last time Chad Chad was independent, past medical history, social history, family history, his functional and cognitive abilities. Chad. Wafer is ambulatory once helped out of a chair. Chad Chad requires to be bathed, assistance with dressing. Chad Chad does feed himself. We talked about cognitive impairments, forgetful. We talked about daily routine. I visited Chad Chad and Chad, Chad Chad was sitting in the chair in his living room. We talked about how he was feeling. Chad. Gillespie smiled frequently throughout PC visit, made jokes, forgetful and at times difficulty following conversation. Chad Chad did answer or update information throughout visit. Chad. Vitanza and  [name] did talk about his designs, career, his accomplishments. We talked about medical goals, daily routine, ros, appetite which remains declined. We talked about no noted weight loss. We talked about quality of life. Chad. Pauley enjoys and misses social interactions. Chad. Mecham did enjoy  hospice team visiting. We talked about the living area he designed with open space, windows out looking bird bath, feeders, gardens. Chad. Wymer endorses he bird watches. We talked about caregiver needs, [name] is his sole caregiver. We talked about role pc in poc. Discussed will have PC RN visit in 4 weeks, then NP PC in 8 weeks, in agreement. Appointment scheduled. Therapeutic listening, emotional support provided. Questions answered. Chad Chad and I did talk about in home primary care with [facility], in agreement to proceed with referral ? ?History obtained from review of EMR, discussion with [name], husband with Chad Chad.  ?I reviewed available labs, medications, imaging, studies and related documents from the EMR.  Records reviewed and summarized above.  ? ?ROS ?10 point system reviewed with Chad Chad with husband, [name] all negative except HPI as Chad Chad has cognitive impairment ? ?Physical Exam: ?Constitutional: NAD ?General: frail appearing, thin, pleasantly confused male ?EYES: lids intact ?ENMT: oral mucous membranes moist ?CV: S1S2, RRR ?Pulmonary: LCTA, no increased work of breathing, no cough, room air ?Abdomen:  normo-active BS + 4 quadrants, soft and non tender ?GU: deferred ?MSK: ambulatory with walker, unsteady gait ?Skin: warm and dry ?Neuro:  + generalized weakness,  + cognitive impairment ?Psych: non-anxious affect, A and Oriented to person,  ?CURRENT PROBLEM LIST:  ?Patient Active Problem List  ? Diagnosis Date Noted  ? Acute encephalopathy 06/18/2021  ? ?PAST MEDICAL HISTORY:  ?Active Ambulatory Problems  ?  Diagnosis Date Noted  ? Acute encephalopathy 06/18/2021  ? ?Resolved Ambulatory Problems  ?  Diagnosis Date Noted  ? No Resolved Ambulatory Problems  ? ?Past Medical History:  ?Diagnosis Date  ? Actinic keratosis   ? Basal cell carcinoma 09/21/2016  ? Diabetes mellitus without complication ([place])   ? Hypertension   ? Squamous cell carcinoma of skin 03/12/2021  ? ?SOCIAL HX:   ?Social History  ? ?Tobacco Use  ? Smoking status: Never  ? Smokeless tobacco: Never  ?Substance Use Topics  ? Alcohol use: Not Currently  ? ?FAMILY HX: History reviewed. No pertinent family history.   ? ?ALLERGIES: No Known Allergies   ?PERTINENT MEDICATIONS:  ?No outpatient encounter medications on file as of 04/23/2022.  ? ?No facility-administered encounter medications on file as of 04/23/2022.  ? ?Thank you for the opportunity to participate in the care of Chad. Hartin.  The palliative care team will continue to follow. Please call our office at [phone] if we can be of additional assistance.  ? ?Linlee Cromie Z [place] Vandergriff, NP ,  ? ?COVID-19 PATIENT SCREENING TOOL ?Asked and negative response unless otherwise noted: ? ?Have you had symptoms of covid, tested positive or been in contact with someone with symptoms/positive test in the past 5-10 days? NO ?

## 2022-05-26 ENCOUNTER — Other Ambulatory Visit: Payer: Medicare Other

## 2022-05-26 ENCOUNTER — Telehealth: Payer: Self-pay

## 2022-05-26 VITALS — BP 94/60 | HR 52 | Temp 97.1°F | Resp 18

## 2022-05-26 DIAGNOSIS — Z515 Encounter for palliative care: Secondary | ICD-10-CM

## 2022-05-26 NOTE — Progress Notes (Signed)
PATIENT NAME: [name] DOB: [birth year] MRN: [id number]  PRIMARY CARE PROVIDER: Idelle Crouch, MD  RESPONSIBLE PARTY:  Acct ID - Guarantor Home Phone Work Phone Relationship Acct Type  [id number] Chad [phone]  Self P/F     [name], [name] [address]    PLAN OF CARE and INTERVENTIONS:               1.  GOALS OF CARE/ ADVANCE CARE PLANNING:  Remain home under the care of his spouse.                2.  PATIENT/CAREGIVER EDUCATION:  Resources               4. PERSONAL EMERGENCY PLAN:  Activate 911 for emergencies.                5.  DISEASE STATUS:  Appetite:  Spouse Charles endorses patient has a poor appetite but weight has been maintaining around 171-174 lbs.  Temporal wasting is noted.  No supplements are being consumed and patient is mostly snacking. Fluid intake is excellent per [name].   Cognitive Status:  [name] notes decline in patient's cognitive function.  He has difficulty following the conversation and relies on [place] help with memory recall.  Patient tires easily during the visit and requested visit to end.   Falls:  Patient sustained a fall about 3 weeks ago in the kitchen.  No injuries are noted.   Functional Status:  Patient does require assistance from sitting to standing.  He is using a wooden cane to maintain his balance while ambulation.  Spouse is assisting with dressing, grooming and hygiene.  Patient is able to feed himself.  [name] reports stress incontinence and patient is wearing depends.   Medication Management:  Patient is no longer taking any medications at his request.   Resources:  Discussed long term plan with patient and [name].  Charles advised AD and HCPOA forms were completed last year.  He is primary contact for patient and HCPOA/FPOA.  [name] is implementing a daily check in with patient's sister.  Resources provided in the event that [name] was unable to care for patient.  We discussed the use of a life  alert but patient would likely not remember to use in the event of an emergency.     HISTORY OF PRESENT ILLNESS:  [name] is a 88 y.o. year old male  with multiple medical problems including dementia.  Patient is followed by Palliative Care every 4-8 weeks and PRN.   CODE STATUS: DNR ADVANCED DIRECTIVES: Yes-not on file MOST FORM: No PPS: 40%   PHYSICAL EXAM:   VITALS: Today's Vitals   05/26/22 1115  BP: 94/60  Pulse: (!) 52  Resp: 18  Temp: (!) 97.1 F (36.2 C)  SpO2: 94%  PainSc: 0-No pain    LUNGS: clear to auscultation  CARDIAC: Cor Brady}  EXTREMITIES: trace edema SKIN: Skin color, texture, turgor normal. No rashes or lesions or mobility and turgor normal  NEURO: positive for gait problems and memory problems       [name], RN

## 2022-05-26 NOTE — Progress Notes (Signed)
COMMUNITY PALLIATIVE CARE SW NOTE  PATIENT NAME: [name] DOB: Sep 01, 1927 MRN: [id number]  PRIMARY CARE PROVIDER: Idelle Crouch, MD  RESPONSIBLE PARTY:  Acct ID - Guarantor Home Phone Work Phone Relationship Acct Type  [id number] [name]  Self P/F     [place], [place] [address]     PLAN OF CARE and INTERVENTIONS:                 GOALS OF CARE/ ADVANCE CARE PLANNING: Goals include to maximize quality of life and symptom management. Our advance care planning conversation included a discussion about:    The value and importance of advance care planning  Review and updating or creation of an advance directive document.                          Code Status: DNR.                          Patients spouse, [name], holds POA.     2.        Palliative care encounter: SW completed visit with PC RN [name], met with patient and patients partner, [name]. Patient lives in a one story home.   Functional changes/updates: Patient has not any functional changes or decline since previous PC visit. No falls reported. Patient is ambulatory with SPC. Spouse provides assistance with ADL's as needed with bathing, dressing and meal prep.    Patient suffers from advance Dementia. Cognitive/memory impairment witnessed during visit. Spouse provided background and hx.   Home & Environment assessment: No concerns. Patient feels safe in his home environment. Patient's home is one level. Patient is able to maneuver around his home without issue with SPC.  Protein calorie malnutrition/weight loss/Appetite: Patient's appetite is fair. No weight loss concerns.   Psychosocial assessment: completed.   Home support: Patients spouse is primary caregiver. Patient is now being followed by [facility].  Transportation: no needs.  Food: no needs.  Safety and long term planning: Spouse holds HCPOA and financial POA. Spouse and PC discussed having a backup or  secondary person in the event spouse is no longer available. Spouse to start telephone check in BID with patients sister, [name], and if sister does not hear from spouse she can then activate 911 for a wellness check to be done at the home, as sister lives out of town. Spouse wishes to keep patient at home for as long as possible. Patient does not have a long term care policy nor will he qualify for Medicaid. Ongoing support/resources will continued to be offered if needed.   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 Patient has no VA connections. No Tricare benefits.  Medical assessment: RN reviewed medications and took vitals.   Hospice discussion: Patient recently discharged from Hospice due to medical stability.  SW discussed goals, reviewed care plan, provided emotional support, used active and reflective listening in the form of reciprocity emotional response. Family wishes to keep patient in the home for as long as possible. Questions and concerns were addressed. The patient/family was encouraged to call with any additional questions and/or concerns. PC Provided general support and encouragement, no other unmet needs identified. Will continue to follow.  3.         PATIENT/CAREGIVER EDUCATION/ COPING:   Appearance: well groomed, appropriate given  situation  Mental Status: alert and oriented  Eye Contact: good Thought Process: semi-rational  Thought Content: not assessed  Speech: Normal rate, volume, tone  Mood: Normal and calm Affect: Congruent to endorsed mood, full ranging Insight: good Judgement: good Interaction Style: Cooperative  Patient A&O with cognitive impairment and increasing memory loss. Patient with some hearing loss, which can cause additional confusion. Patient actively engaged in conversation and attempt to answer all questions, with assistance from spouse. Patient presents with jovial personality. Patient is  not as active/social as he used to be. 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 Patient sits majority of the day watching and feeding the birds and squirrels. Patient used to travel to [place] every weekend to visit his brother, whom has since passed away. PHQ-9 not assessed. Family (sister and niece) is supportive and provides physical and emotional support to patient and spouse.  4.         PERSONAL EMERGENCY PLAN:  Patient/spouse will call 9-1-1 for emergencies.    5.         COMMUNITY RESOURCES COORDINATION/ HEALTH CARE NAVIGATION:  spouse manages his care.  6.          FINANCIAL CONCERNS/NEEDS: None.                        Primary Health Insurance: [facility] Medicare Secondary Health Insurance: None Prescription Coverage: Yes, no history of difficulty obtaining or affording prescriptions reported     SOCIAL HX:  Social History   Tobacco Use   Smoking status: Never   Smokeless tobacco: Never  Substance Use Topics   Alcohol use: Not Currently    CODE STATUS: DNR  ADVANCED DIRECTIVES: Y MOST FORM COMPLETE:  N HOSPICE EDUCATION PROVIDED: Y  PPS: patient is SUP-MIN A with ADL'S.  Patient is A&O  with poor-fair insight and judgement.    Time spent: 1 hr      [name], [name]

## 2022-05-26 NOTE — Telephone Encounter (Signed)
NA

## 2022-06-12 ENCOUNTER — Other Ambulatory Visit: Payer: Medicare Other

## 2022-06-12 VITALS — BP 90/50 | HR 51 | Temp 97.3°F

## 2022-06-12 DIAGNOSIS — Z515 Encounter for palliative care: Secondary | ICD-10-CM

## 2022-06-12 NOTE — Progress Notes (Signed)
PATIENT NAME: [name] DOB: Dec 29, 1926 MRN: [id number]  PRIMARY CARE PROVIDER: Marguarite Arbour, MD  RESPONSIBLE PARTY:  Acct ID - Guarantor Home Phone Work Phone Relationship Acct Type  [id number] Lillia Abed* [phone]  Self P/F     [address], [place], [place] [address]    Follow up visit made to assess patient.  Sister Bonita Quin and spouse Billey Gosling present for visit.  Patient found in the bed with eyes closed.  Awakens easily to verbal stimulation.  No apparent distress or pain noted.  Decreased over the last 3 days.  Taking liquids in well but food intake has only been bites.   Continues to get up daily and ambulating with one person assistance.  Using the bedside commode more often.  Continues with regular bowel movements.  Billey Gosling will continue to monitor patient over the weekend and contact PC if patient continues with decline.  May need to re-evaluate for hospice if decline continues.  Christin Gusler, NP updated on visit.      VITALS: Today's Vitals   06/12/22 1322  BP: (!) 90/50  Pulse: (!) 51  Temp: (!) 97.3 F (36.3 C)  SpO2: 93%    LUNGS: clear to auscultation  CARDIAC: Cor Brady}  SKIN: Skin color, texture, turgor normal. No rashes or lesions or mobility and turgor normal  NEURO: positive for gait problems and memory problems       Truitt Merle, RN

## 2022-06-18 ENCOUNTER — Other Ambulatory Visit: Payer: Medicare Other

## 2022-06-18 DIAGNOSIS — Z789 Other specified health status: Secondary | ICD-10-CM

## 2022-06-18 NOTE — Progress Notes (Signed)
Palliative care SW outreached patient to complete telephonic follow up visit.    Palliative care SW outreached patient's spouse, [name], to complete telephonic follow up visit.  Spouse shared that patient is not eating as much and has lost 4lbs over a period of 2 weeks. Patients fluid intake remains good. Spouse is hopeful that patient's appetite will return. Patient continues to be ambulatory.   Psychosocial assessment: completed.   Long term planning: Spouse is to [employer] [phone], to acquire guidance on finances and assets in the event patient requires placement and have to apply for Medicaid.  Support: Spouse shared that patients family is supportive and will have the ability to be more present if needed.     SW discussed goals, reviewed care plan, provided emotional support, used active and reflective listening in the form of reciprocity emotional response. Palliative care will continue to monitor and assist with long term care planning as needed.   Next Palliative care visit scheduled for 07/15/2022 '@2pm'$  with [facility] NP.

## 2022-06-19 ENCOUNTER — Other Ambulatory Visit: Payer: Medicare Other

## 2022-07-15 ENCOUNTER — Other Ambulatory Visit: Payer: Medicare Other | Admitting: Nurse Practitioner

## 2022-07-15 ENCOUNTER — Encounter: Payer: Self-pay | Admitting: Nurse Practitioner

## 2022-07-15 DIAGNOSIS — R630 Anorexia: Secondary | ICD-10-CM

## 2022-07-15 DIAGNOSIS — F0280 Dementia in other diseases classified elsewhere without behavioral disturbance: Secondary | ICD-10-CM

## 2022-07-15 DIAGNOSIS — R634 Abnormal weight loss: Secondary | ICD-10-CM

## 2022-07-15 DIAGNOSIS — Z515 Encounter for palliative care: Secondary | ICD-10-CM

## 2022-07-15 NOTE — Progress Notes (Signed)
[employer] Palliative Care Consult Note Telephone: [phone]  Fax: [phone]    Date of encounter: 07/15/22 2:12 PM PATIENT NAME: Chad Chad [id number]   [phone] (home)  DOB: 1927-02-27 MRN: [id number] PRIMARY CARE PROVIDER:   [place], MD,  [address] [place] [address] [phone]  RESPONSIBLE PARTY:    Contact Information     Name Relation Home Work [place] [phone]     Valaria Good   [phone]     1. Advance Care Planning;  DNR; discussed at length medical goals, wishes with weight loss, anorexia, functional with worsening gait, unable to bath in the shower to unsteady; weaker in BLE, incontinence bowel and bladder consistently which is new and ongoing cognitive decline worsening memory, ongoing weight loss, sleeping >16 or more hours a day to have hospice re-evaluate for services. I called [facility] for order, received from Glory Buff NP to proceed with hospice order.     2. Weight loss; Anorexia, progressive, chronic; discussed nutrition, supplements, continue to weight, continue to monitor weights, appetite <25% per most meals. Decrease oral intake.  05/2021 weight 214.95 lbs 05/05/2022 weight 174.6 lbs 06/22/2022 weight 172 lbs 07/15/2022 weight 165 lbs 49.95 lbs/12 months as 23.24% loss;  9.6 lbs/10 weeks as 5.50%; 7lbs/4 weeks 1.49%; steady weight loss monthly   3. Unsteady gait using walker, discussed safety precautions, fall risk, adapted the environment for safety.    4. Goals of Care: Goals include to maximize quality of life and symptom management. Our advance care planning conversation included a discussion about:    The value and importance of advance care planning  Exploration of personal, cultural or spiritual beliefs that might influence medical decisions  Exploration of goals of  care in the event of a sudden injury or illness  Identification and preparation of a healthcare agent  Review and updating or creation of an advance directive document.   I spent 61 minutes providing this consultation. More than 50% of the time in this consultation was spent in counseling and care coordination. PPS: 40%   Chief Complaint: Initial palliative consult for complex medical decision making   HISTORY OF PRESENT ILLNESS:  Chad Chad is a 88 y.o. year old male  with multiple medical problems including dementia. Chad Chad was recently d/c from hospice services due to stability. I called Chad Chad, Chad Chad to confirm in person pc visit, in agreement.  visited Chad Chad and Chad, Chad Chad was sitting in the chair in his living room. We talked about how he was feeling. Chad Chad smiled frequently throughout PC visit, made jokes, forgetful and at times difficulty following conversation. Chad Chad did continue to ask the same questions over and over throughout the visit, appears tired, no distress. We talked about ros, new changes of incontinence of bowel, bladder consistently, worsening gait, having to lift out of the chair to standing position, unable to stand for bathing in the shower, more difficult to get to eat <25% meals if he eats, ongoing weight loss. Worsening memory. Sleeping >16 hrs daily. Discussed medical goals with focus on comfort at home, discussed re-visiting hospice, Chad Chad in agreement. Therapeutic listening, emotional support provided. Discussed will call [facility] for order for re-evaluation. We talked about caregiver stress, fatigue, coping strategies.   Therapeutic listening, emotional support provided. Questions answered. Chad Chad and I did talk  about in home primary care with [facility], in agreement to proceed with referral   History obtained from review of EMR, discussion with Chad Chad, Chad with Chad Chad.  I reviewed available labs,  medications, imaging, studies and related documents from the EMR.  Records reviewed and summarized above.    ROS 10 point system reviewed with Chad Chad with Chad, Chad Chad all negative except HPI as Chad Chad has cognitive impairment   Physical Exam: Constitutional: NAD General: frail appearing, thin, pleasantly confused male EYES: lids intact ENMT: oral mucous membranes moist CV: S1S2, RRR Pulmonary: LCTA, no increased work of breathing, no cough, room air Abdomen:  normo-active BS + 4 quadrants, soft and non tender GU: deferred MSK: ambulatory with walker, unsteady gait Skin: warm and dry Neuro:  + generalized weakness,  + cognitive impairment Psych: non-anxious affect, A and Oriented to person,  I met face to face with patient and family in home. Palliative Care was asked to follow this patient by consultation request of  Sparks, Leonie Douglas, MD to address advance care planning and complex medical decision making. This is a follow up visit.                                  ASSESSMENT AND PLAN /RECOMMENDATIONS:  Thank you for the opportunity to participate in the care of Chad Chad.  The palliative care team will continue to follow. Please call our office at [phone] if we can be of additional assistance.   Chad Chad Chad Abbrielle Batts, NP   COVID-19 PATIENT SCREENING TOOL Asked and negative response unless otherwise noted:   Have you had symptoms of covid, tested positive or been in contact with someone with symptoms/positive test in the past 5-10 days?  NO
